# Patient Record
Sex: Female | Born: 1955 | Race: White | Hispanic: No | Marital: Married | Smoking: Former smoker
Health system: Southern US, Community
[De-identification: ages and names within clinical notes are randomized; demographics above are authoritative.]

## PROBLEM LIST (undated history)

## (undated) DIAGNOSIS — E039 Hypothyroidism, unspecified: Secondary | ICD-10-CM

## (undated) DIAGNOSIS — Z8719 Personal history of other diseases of the digestive system: Secondary | ICD-10-CM

## (undated) DIAGNOSIS — T8859XA Other complications of anesthesia, initial encounter: Secondary | ICD-10-CM

## (undated) DIAGNOSIS — K921 Melena: Secondary | ICD-10-CM

## (undated) DIAGNOSIS — F411 Generalized anxiety disorder: Secondary | ICD-10-CM

## (undated) DIAGNOSIS — Z9689 Presence of other specified functional implants: Secondary | ICD-10-CM

## (undated) DIAGNOSIS — K219 Gastro-esophageal reflux disease without esophagitis: Secondary | ICD-10-CM

## (undated) DIAGNOSIS — Z87891 Personal history of nicotine dependence: Secondary | ICD-10-CM

## (undated) DIAGNOSIS — R569 Unspecified convulsions: Secondary | ICD-10-CM

## (undated) DIAGNOSIS — I10 Essential (primary) hypertension: Secondary | ICD-10-CM

## (undated) DIAGNOSIS — F32A Depression, unspecified: Secondary | ICD-10-CM

## (undated) DIAGNOSIS — Z9889 Other specified postprocedural states: Secondary | ICD-10-CM

## (undated) DIAGNOSIS — R112 Nausea with vomiting, unspecified: Secondary | ICD-10-CM

## (undated) DIAGNOSIS — I071 Rheumatic tricuspid insufficiency: Secondary | ICD-10-CM

## (undated) DIAGNOSIS — T4145XA Adverse effect of unspecified anesthetic, initial encounter: Secondary | ICD-10-CM

## (undated) DIAGNOSIS — I83819 Varicose veins of unspecified lower extremities with pain: Secondary | ICD-10-CM

## (undated) DIAGNOSIS — K529 Noninfective gastroenteritis and colitis, unspecified: Secondary | ICD-10-CM

## (undated) DIAGNOSIS — K589 Irritable bowel syndrome without diarrhea: Secondary | ICD-10-CM

## (undated) DIAGNOSIS — R002 Palpitations: Secondary | ICD-10-CM

## (undated) DIAGNOSIS — M1990 Unspecified osteoarthritis, unspecified site: Secondary | ICD-10-CM

## (undated) DIAGNOSIS — E041 Nontoxic single thyroid nodule: Secondary | ICD-10-CM

## (undated) DIAGNOSIS — M8580 Other specified disorders of bone density and structure, unspecified site: Secondary | ICD-10-CM

## (undated) DIAGNOSIS — R079 Chest pain, unspecified: Secondary | ICD-10-CM

## (undated) DIAGNOSIS — N390 Urinary tract infection, site not specified: Secondary | ICD-10-CM

## (undated) DIAGNOSIS — E042 Nontoxic multinodular goiter: Secondary | ICD-10-CM

## (undated) HISTORY — PX: OOPHORECTOMY: SHX86

## (undated) HISTORY — PX: APPENDECTOMY: SHX54

## (undated) HISTORY — PX: COLON SURGERY: SHX602

## (undated) HISTORY — PX: KNEE ARTHROSCOPY: SUR90

## (undated) HISTORY — PX: IMPLANTATION VAGAL NERVE STIMULATOR: SUR692

## (undated) HISTORY — PX: CHOLECYSTECTOMY: SHX55

## (undated) HISTORY — PX: SHOULDER ARTHROSCOPY: SHX128

## (undated) HISTORY — PX: WRIST SURGERY: SHX841

## (undated) HISTORY — PX: SALIVARY GLAND SURGERY: SHX768

---

## 1994-06-16 HISTORY — PX: AUGMENTATION MAMMAPLASTY: SUR837

## 2009-08-28 LAB — URINALYSIS W/O MICRO
Bilirubin UA, confirm: NEGATIVE
Glucose: 100 MG/DL
Ketone: >=80 MG/DL — AB
Leukocyte Esterase: NEGATIVE
Nitrites: NEGATIVE
Protein: 30 MG/DL — AB
Specific gravity: >=1.03 (ref 1.005–1.030)
Urobilinogen: 0.2 EU/DL (ref 0.2–1.0)
pH (UA): 6 (ref 4.6–8.0)

## 2009-08-28 LAB — METABOLIC PANEL, COMPREHENSIVE
A-G Ratio: 0.9 (ref 0.7–2.8)
ALT (SGPT): 29 U/L — ABNORMAL LOW (ref 30–65)
AST (SGOT): 21 U/L (ref 15–37)
Albumin: 4.3 g/dL (ref 3.5–4.7)
Alk. phosphatase: 113 U/L (ref 38–126)
Anion gap: 15 mmol/L (ref 10–20)
BUN: 18 mg/dL (ref 7–18)
Bilirubin, total: 0.5 mg/dL (ref 0.0–1.0)
CO2: 22 mmol/L (ref 21–32)
Calcium: 9.7 mg/dL (ref 8.5–10.1)
Chloride: 104 mmol/L (ref 98–107)
Creatinine: 1.1 mg/dL (ref 0.6–1.3)
Globulin: 4.6 g/dL (ref 1.7–4.7)
Glucose: 127 mg/dL — ABNORMAL HIGH (ref 74–106)
Potassium: 3.4 mmol/L — ABNORMAL LOW (ref 3.5–5.1)
Protein, total: 8.9 g/dL — ABNORMAL HIGH (ref 6.4–8.2)
Sodium: 138 mmol/L (ref 136–145)

## 2009-08-28 LAB — CBC WITH AUTOMATED DIFF
ATYPICAL LYMPHS: 2 % — ABNORMAL HIGH
BASOPHILS: 1 % (ref 0–3)
HCT: 49 % — ABNORMAL HIGH (ref 36.0–47.0)
HGB: 17.4 g/dL — ABNORMAL HIGH (ref 12.0–16.0)
LYMPHOCYTES: 4 % — ABNORMAL LOW (ref 18–40)
MCH: 31.6 PG (ref 27.0–35.0)
MCHC: 35.5 g/dL (ref 30.7–37.3)
MCV: 88.9 FL (ref 81.0–94.0)
MONOCYTES: 6 % (ref 2–12)
MPV: 10.4 FL (ref 9.2–11.8)
NEUTROPHILS: 87 % — ABNORMAL HIGH (ref 48–72)
PLATELET ESTIMATE: ADEQUATE
PLATELET: 312 10*3/uL (ref 130–400)
RBC: 5.51 M/uL — ABNORMAL HIGH (ref 4.20–5.40)
RDW: 13.2 % (ref 11.5–14.0)
WBC: 24 10*3/uL — ABNORMAL HIGH (ref 4.8–10.6)

## 2009-08-28 LAB — URINE MICROSCOPIC: Crystals, urine: NONE SEEN /LPF

## 2009-08-28 LAB — LIPASE: Lipase: 119 U/L (ref 65–230)

## 2009-08-28 MED ORDER — ONDANSETRON HCL 4 MG TAB
4 mg | ORAL_TABLET | Freq: Three times a day (TID) | ORAL | Status: DC | PRN
Start: 2009-08-28 — End: 2012-09-02

## 2009-08-28 MED ORDER — SODIUM CHLORIDE 0.9 % IV
INTRAVENOUS | Status: DC
Start: 2009-08-28 — End: 2009-08-28

## 2009-08-28 MED ORDER — ONDANSETRON HCL 2 MG/ML IV
2 mg/mL | INTRAVENOUS | Status: DC | PRN
Start: 2009-08-28 — End: 2009-08-28

## 2009-08-28 MED ORDER — KETOROLAC TROMETHAMINE 30 MG/ML INJECTION
30 mg/mL (1 mL) | INTRAMUSCULAR | Status: DC
Start: 2009-08-28 — End: 2009-08-28

## 2009-08-28 MED FILL — ONDANSETRON HCL 2 MG/ML IV: 2 mg/mL | INTRAVENOUS | Qty: 2

## 2009-08-28 MED FILL — KETOROLAC TROMETHAMINE 30 MG/ML INJECTION: 30 mg/mL (1 mL) | INTRAMUSCULAR | Qty: 1

## 2009-08-28 NOTE — ED Notes (Signed)
Pt pain tolerable. Pt feeling better after zofran. Pt does not want pain medication.

## 2009-08-28 NOTE — ED Notes (Signed)
IV out. Tip intact.

## 2009-08-28 NOTE — ED Notes (Signed)
Pt being discharged to home. Pt verbalized an understanding of all discharge instructions. Pt left in no acute distress. Prescription given.

## 2009-08-28 NOTE — ED Notes (Signed)
Pt in no distress . VSS.

## 2009-08-28 NOTE — ED Notes (Signed)
Pt has had Abd Pain, Vomiting, Nausea, and diarrhea since yesterday afternoon.

## 2009-08-28 NOTE — ED Provider Notes (Signed)
Patient is a 54 y.o. female presenting with abdominal pain. The history is provided by the patient.   Abdominal Pain   This is a new problem. The current episode started yesterday. The problem occurs constantly. The problem has not changed since onset. The pain is associated with vomiting. The pain is located in the generalized abdominal region. The quality of the pain is cramping and sharp. Associated symptoms include diarrhea, nausea and vomiting. The pain is worsened by eating. The pain is relieved by nothing.   pt c/o gen crampy abd pain in association with multiple episodes of vomiting and diarea since yesterday.  No blood in stool    Past Medical History   Diagnosis Date   ??? Diverticulitis 12/10   ??? Epilepsy           Past Surgical History   Procedure Date   ??? Hc nerve location stimulator 03/2001     vagus nerve stimulator, per patient   ??? Excision of salivary gland    ??? Removal gallbladder    ??? Hx appendectomy    ??? Pr removal of ovary(s)    ??? Hx gyn            No family history on file.     History   Social History   ??? Marital Status: Married     Spouse Name: N/A     Number of Children: N/A   ??? Years of Education: N/A   Occupational History   ??? Not on file.   Social History Main Topics   ??? Smoking status: Former Smoker   ??? Smokeless tobacco: Never Used   ??? Alcohol Use: Yes      once a year   ??? Drug Use: No   ??? Sexually Active: Yes -- Female partner(s)   Other Topics Concern   ??? Not on file   Social History Narrative   ??? No narrative on file       Prior to Admission medications    Medication Sig Start Date End Date Taking? Authorizing Provider   levothyroxine (LEVOXYL) 75 mcg tablet Take 75 mcg by mouth daily (before breakfast).   Yes Phys Other, MD   amitriptyline (ELAVIL) 150 mg tablet Take 20 mg by mouth nightly.   Yes Phys Other, MD           ALLERGIES: Dilantin and Depakote      Review of Systems   Constitutional: Negative.    HENT: Negative.    Eyes: Negative.    Respiratory: Negative.     Cardiovascular: Negative.    Gastrointestinal: Positive for nausea, vomiting, abdominal pain and diarrhea.   Genitourinary: Negative.    Musculoskeletal: Negative.    Skin: Negative.    Neurological: Negative.        Filed Vitals:    08/28/09 0619 08/28/09 0836   BP: 137/93 123/76   Pulse: 115 91   Temp:  97.3 ??F (36.3 ??C)   Resp: 18    Height: 5\' 6"     Weight: 160 lb (72.576 kg)    SpO2: 100% 100%              Physical Exam   Nursing note and vitals reviewed.  Constitutional: She is oriented to person, place, and time. No distress.   HENT:   Head: Normocephalic and atraumatic.   Eyes: Extraocular motions are normal. Pupils are equal, round, and reactive to light.   Neck: Neck supple.   Cardiovascular: Normal rate, regular rhythm  and normal heart sounds.  Exam reveals no gallop and no friction rub.    No murmur heard.  Pulmonary/Chest: Effort normal. No respiratory distress. She has no wheezes. She has no rales.   Abdominal: Soft. She exhibits no distension. Tenderness (diffuse) is present. She has no rebound and no guarding.   Musculoskeletal: Normal range of motion. She exhibits no edema.   Neurological: She is alert and oriented to person, place, and time. No cranial nerve deficit.   Skin: No rash noted.        MDM Coding   Reviewed: vitals and nursing note        Procedures    Impression- ddx includes gastroenteritis, colitis, appendicitis, diverticulitis, pyelo  Plan- cbc, lytes, ua, ivf, pain meds, ct abd/pelvis, observe, reases    Progress note: labs noted.  CT- no acute pathology as per radiology reading.  Pt feels better, abd pain is crampy,. abd soft non-tender to distraction.   On and off.  Pt tolerated po intake w/o difficulty in ed.  Wbc noted.  Given clinical appearance and ct findings- likely gastroenteritis.  Disc with pt therapeutic options.  Pt prefers to go home, po hydration.  Agrees to return to ed immediately for worsenig pain, recurence of vomitnig or fever.  12:37 PM

## 2009-08-28 NOTE — ED Notes (Signed)
Pt finished drinking po contrast. CT tech aware.

## 2009-08-28 NOTE — ED Notes (Signed)
Pt nausea relieved from zofran. Pt started drinking po contrast.

## 2009-08-28 NOTE — ED Notes (Signed)
Pt to CT. Pt in no distress.

## 2012-04-01 NOTE — Patient Instructions (Signed)
Preventative Measures:   Safety- Safety reviewed; no drunk driving, no texting/phone holding while driving, always use seatbelts and safety equipment  Sunscreen- Reviewed sunscreen use, at least SPF 15 any time of year, if outside more than 5 minutes  Detectors: Patient has smoke detector and carbon monoxide detector  Diet and Exercise: Reviewed a healthy diet and recommended regular exercise 3+ times per week, 30+ minutes per episode  Self exams: Reviewed self breast exam monthly  MMG done 4/13, due 4/14  Gynecology/PAP done years ago, due now  Screening colonoscopy due 2016  DEXA done more than 2 years ago, due now  Vaccinations- tetanus due 2017, shingles vaccine at 60, Pneumovax/Prevnar at 65, flu shot today    Follow-up as needed or in one year for exam

## 2012-04-01 NOTE — Progress Notes (Signed)
[facility] Annual Wellness Visit    Date: 04/01/2012    Name: Vanessa Wilson       DOB: 1956/02/08     History of Present Illness: Vanessa Wilson is a 56 y.o. year old female who is here for annual exam. She states that she went to see a bereavement counselor yesterday for the first time.  Her 5 year-old son died of a drug overdose 2 years ago and she is still extremely depressed about it.  She feels better that she went.  The counselor gave her some hope that she might get better.  She feels pains all over but she is not sure if the pain is real or psychological.  She states that her joints are stiff and achy.  She still has right knee pain after surgery and joint injections, but her insurance changed so she cannot see Dr. Darliss Cheney anymore because they do not take that insurance.  She is not doing much exercise as a result.  She is also eating more and gaining weight.  This makes her more depressed.     Exercise: not much     Diet: too much quantity and not the right foods    Living will: yes    Past Medical History/Surgical History:  Past Medical History   Diagnosis Date   ??? Diverticulitis 12/10   ??? Epilepsy    ??? IBS (irritable bowel syndrome)    ??? Fibromyalgia    ??? Chronic gastritis    ??? Thyromegaly      Past Surgical History   Procedure Laterality Date   ??? Hc nerve location stimulator  03/2001     vagus nerve stimulator, per patient   ??? Excision of salivary gland     ??? Hx cholecystectomy     ??? Hx salpingo-oophorectomy       left   ??? Hx colectomy       low anterior resection   ??? Hx tubal ligation     ??? Hx cyst removal     ??? Hx hemorrhoidectomy     ??? Hx shoulder arthroscopy       right rotator cuff repair       Family History:  family history is not on file.    Medications:  Current Outpatient Prescriptions   Medication Sig Dispense Refill   ??? amitriptyline (ELAVIL) 25 mg tablet Take  by mouth nightly.       ??? levothyroxine (SYNTHROID) 50 mcg tablet Take  by mouth Daily (before breakfast).       ??? multivitamin (ONE A DAY)  tablet Take 1 Tab by mouth daily.       ??? zolpidem (AMBIEN) 10 mg tablet Take  by mouth nightly as needed for Sleep.       ??? omeprazole (PRILOSEC) 20 mg capsule Take 20 mg by mouth daily as needed. Indications: GASTROESOPHAGEAL REFLUX       ??? LORazepam (ATIVAN) 1 mg tablet Take  by mouth every four (4) hours as needed for Anxiety.       ??? omega-3 fatty acids-vitamin e (FISH OIL) 1,000 mg cap Take 1 Cap by mouth.       ??? ondansetron hcl (ZOFRAN) 4 mg tablet Take 1 Tab by mouth every eight (8) hours as needed for Nausea.  10 Tab  0     No current facility-administered medications for this visit.        Allergies:  Allergies   Allergen Reactions   ??? Dilantin (Phenytoin  Sodium Extended) Itching   ??? Depakote (Divalproex) Hives       Social History:   History   Smoking status   ??? Former Smoker   Smokeless tobacco   ??? Never Used     History   Alcohol Use   ??? Yes     Comment: once a year     History   Drug Use No       Review of Systems:  General:  No fevers, chills, night sweats, lymph node enlargement, or unintentional weight loss; there is a fullness in her left neck that she thinks might be scar tissue or maybe her thyroid  HENT:  No ear pain, ear discharge, hearing loss, sinus problems, nose bleeds, allergies, sore throats, gum bleeding, face pain.  Eyes:  No pain, blurred vision, double vision, wears glasses for reading  Pulmonary: no wheeze, cough, shortness of breath, no pain  Heart: + rest chest tightness without associated symptoms of palpitations and dyspne; no, hypertension, or high cholesterol  GI:  No abdominal pain, nausea, or vomiting,+  heartburn, no dysphagia, change in bowel habits, hematochezia or black stools  GU:  No urgency, frequency, dysuria, incontinence; has once per night nocturia  Neurology:  No dizziness, numbness/tingling, weakness, seizures  Skin:  No rashes, abnormal moles or hives  Musculoskeletal:  + back pain, neck pain, and joint pains (diffuse and also specific right knee pain)  Psych:  + 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 Cervical Cancer Scrn Pap Smear overdue due   ??? Breast Cancer Scrn Mammogram done 09/23/11 4/14   ??? Colon Cancer Scrn Colonoscopy done 07/2009 2/16   ??? DEXA not in a few years due       Vaccinations:    Immunization History   Administered Date(s) Administered   ??? Influenza, Seasonal, Injectable 03/31/2012   ??? Td (Adult) 10/14/2005        Physical Exam:  Filed Vitals:    03/31/12 1049 03/31/12 1058   BP: 124/80 120/78   Pulse:  92   Height: 5\' 6"  (1.676 m)    Weight: 178 lb (80.74 kg)      Body mass index is 28.74 kg/(m^2).    HEAD: WNL, NCAT, PERRL, EOMI, no conj erythema, no jaundice, FUNDI-normal  EARS: WNL, no cerumen, no otitis media, no otitis external  NOSE: WNL, no polyps, no bleeding, no erythema  MOUTH: WNL, no erythema, no exudates, no enlarged tonsils  NECK: WNL, no bruit, no masses, possible thyromegaly vs left neck fullness from scar tissue, no nodes, no JVD  HEART: WNL, RRR: normal S1, S2: no S3 or S4; no rubs or murmurs  LUNGS: WNL, resonant, clear, no wheezing, no ronchi, no rales  ABD: WNL, no hepatomegaly, splenomegaly, guarding, rebound, or tenderness  BREAST: no fibrocystic changes; no nodes, discharge, or masse; no LAD  BACK: WNL, no CVA-T, spinous tendernous, or spasm  EXT: no clubbing, cyanosis, or edema; 2+ radial pulses, 2+ dorsalis pedis pulses, 2+ posterior tibialis pulses  SKIN: no rash, nodules, or cysts; mulitple non-suspicious moles  NEURO: WNL, CN III-XII normal, sensory intact to light touch, motor 5/5 bilaterally, reflexes 2+/2+ except Achilles -/-  PSYCH: AFFECT-sad to normal, ORIENTATION- normal to person, place, and time    LABS: No results found for this or any previous visit (from the past 24 hour(s)).  10/31/11 CBC and CMP were normal  ECG: from 10/31/11 NSR rate 70, normal axis and intervals, Rsr' in V1 and V2, T wave inversion in III    Impression/Plan:   1. Annual physical exam  : see below    2. Need for prophylactic vaccination and inoculation against influenza  INFLUENZA VIRUS VACCINE, SPLIT, IN INDIVIDS. >=3 YRS OF AGE, IM, PR IMMUNIZ ADMIN,1 SINGLE/COMB VAC/TOXOID   3. Fibromyalgia : possible that this is why the patient is complaining of aches.  I believe that she will benefit greatly from therapy for her depression and that improvement in the depression will help alleviate her pain.    4. Epilepsy : controlled, no seizure activity in years.    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  Patient may return as needed.   She is low dose amitriptyline for her fibromyalgia but if she does not improve she may benefit from additional antidepressant.    6. IBS (irritable bowel syndrome) :  Patient with diarrhea and constipation on and off.  Not interfering with lifestyle.  Attempt diet control.  Probiotic may be helpful    7. Knee pain, right :  To follow-up with orthopedics as this will help the patient get back to exercise.    8. Insomnia:  Continue Ambien as needed.    9. Thyromegaly/left neck fullness:  Will check thyroid and left neck ultrasound.  Will also check TSH.          Preventative Measures:   Safety- Safety reviewed; no drunk driving, no texting/phone holding while driving, always use seatbelts and safety equipment  Sunscreen- Reviewed sunscreen use, at least SPF 15 any time of year, if outside more than 5 minutes  Detectors: Patient has smoke detector and carbon monoxide detector  Diet and Exercise: Reviewed a healthy diet and recommended regular exercise 3+ times per week, 30+ minutes per episode  Self exams: Reviewed self breast exam monthly  MMG done 4/13, due 4/14  Gynecology/PAP done years ago, due now  Screening colonoscopy due 2016  DEXA done more than 2 years ago, due now  Vaccinations- tetanus due 2017, shingles vaccine at 60, Pneumovax/Prevnar at 65, flu shot today    Follow-up as needed or in one year for exam      Carolin Sicks, MD    04/01/2012

## 2012-04-14 LAB — ANA BY MULTIPLEX FLOW IA, QL
ANA, DIRECT: NEGATIVE
Antinuclear Antibodies Direct: NEGATIVE

## 2012-04-14 LAB — RHEUMATOID FACTOR
Rheumatoid Factor: 9.5 IU/mL (ref 0.0–13.9)
Rheumatoid factor: 9.5 IU/mL (ref 0.0–13.9)

## 2012-04-14 LAB — TSH 3RD GENERATION: TSH: 3.27 u[IU]/mL (ref 0.450–4.500)

## 2012-04-14 LAB — LIPID PANEL WITH LDL/HDL RATIO
Cholesterol, total: 238 mg/dL — ABNORMAL HIGH (ref 100–199)
HDL Cholesterol: 58 mg/dL (ref >39)
LDL, calculated: 149 mg/dL — ABNORMAL HIGH (ref 0–99)
LDL/HDL Ratio: 2.6 ratio units (ref 0.0–3.2)
Triglyceride: 153 mg/dL — ABNORMAL HIGH (ref 0–149)
VLDL, calculated: 31 mg/dL (ref 5–40)

## 2012-04-14 LAB — AMBIG ABBREV LP DEFAULT

## 2012-04-14 LAB — LYME IGG,IGM W RFLX CONFIRM: Lyme Ab, IgG/IgM: <0.91 index (ref 0.00–0.90)

## 2012-04-14 LAB — VITAMIN B12 & FOLATE
Folate: 15.8 ng/mL (ref >3.0)
Vitamin B12: 706 pg/mL (ref 211–946)

## 2012-04-14 LAB — SED RATE (ESR): Sed rate (ESR): 6 mm/hr (ref 0–40)

## 2012-04-14 NOTE — Progress Notes (Signed)
The patient's UA from 10/16 was sent out and a UCx was done. Her UA was still abnormal but it was not a good sample as it had many epithelial cells.  The patient should return and repeat the UA.  She needs a mid-stream specimen.

## 2012-04-15 ENCOUNTER — Telehealth

## 2012-04-15 LAB — LYME AB, IGG & IGM BY WB
IgG P18 Ab: ABSENT
IgG P23 Ab: ABSENT
IgG P28 Ab: ABSENT
IgG P30 Ab: ABSENT
IgG P39 Ab: ABSENT
IgG P41 Ab: ABSENT
IgG P45 Ab: ABSENT
IgG P58 Ab: ABSENT
IgG P93 Ab: ABSENT
IgM P23 Ab: ABSENT
IgM P39 Ab: ABSENT
IgM P41 Ab: ABSENT
Lyme Ab, IgG WB Interp.: NEGATIVE
Lyme Ab, IgM WB Interp.: NEGATIVE

## 2012-04-15 NOTE — Telephone Encounter (Signed)
I called and left a message for patient to call me back.  Her DEXA showed osteopenia (some bone density loss) and her labs showed a high cholesterol, normal TSH, B12, folate, ESR, RF.  Patient really needs to exercise and eat well, but may need medication of she cannot control the cholesterol with diet and exercise alone.  She may be given the results if she calls back.

## 2012-04-15 NOTE — Progress Notes (Signed)
Quick Note:    The cholesterol is high (TC 238, LDL 149, TG 238, HDL 58), Z61, folate, TSH, ESR, RF are normal or negative.  ______

## 2012-04-19 NOTE — Telephone Encounter (Signed)
Left pt a message to call back for results/ as per Dr. Cherlynn June she needs to repeat her UA, she should urinate into the toilet 1st(a little) then into the specimen cup to get a clean catch

## 2012-04-21 NOTE — Progress Notes (Signed)
Quick Note:    ANA and lyme titers are negative.  ______

## 2012-04-21 NOTE — Telephone Encounter (Signed)
I called the patient to discuss her lab test results.  In addition, her ANA and lyme titers are negative.  I left her a message to call me back.

## 2012-04-21 NOTE — Telephone Encounter (Signed)
I called and spoke to the patient.  I gave her all the results of her labs, DEXA, and neck and thyroid ultrasounds.  She will go for an U/S-guided right thyroid nodule biopsy (request will be picked up).  She would like a prescription for the omeprazole, levothyroxine, and amitriptyline sent to [facility] in [place] (which was just done).  She will change her diet and attempt exercise (even though limited by her knee pain) in order to control the cholesterol and that should be repeated in 3 months.  She will try resistance training for her osteopenia.

## 2012-04-22 MED ORDER — LEVOTHYROXINE 50 MCG TAB
50 mcg | ORAL_TABLET | Freq: Every day | ORAL | Status: DC
Start: 2012-04-22 — End: 2013-01-28

## 2012-04-22 MED ORDER — OMEPRAZOLE 20 MG CAP, DELAYED RELEASE
20 mg | ORAL_CAPSULE | Freq: Every day | ORAL | Status: DC | PRN
Start: 2012-04-22 — End: 2013-03-07

## 2012-04-22 MED ORDER — AMITRIPTYLINE 25 MG TAB
25 mg | ORAL_TABLET | Freq: Every evening | ORAL | Status: DC
Start: 2012-04-22 — End: 2013-05-09

## 2012-07-13 LAB — HVMA POC URINALYSIS DIP STICK AUTO W/O MICRO
Bilirubin (POC): NEGATIVE
Glucose, urine (POC): NEGATIVE
Ketones (POC): NEGATIVE
Nitrites (UA POC): NEGATIVE
Specific gravity (UA POC): 1.025 (ref 1.001–1.035)
Urobilinogen (UA POC): 0.2 (ref 0.2–1)
pH (UA POC): 6.5 (ref 4.6–8.0)

## 2012-07-13 MED ORDER — LORAZEPAM 1 MG TAB
1 mg | ORAL_TABLET | Freq: Three times a day (TID) | ORAL | Status: DC | PRN
Start: 2012-07-13 — End: 2012-12-08

## 2012-07-13 NOTE — Patient Instructions (Addendum)
Rest.    Make sure that you drink water to quench your thirst.    Humidifier and hot steamy showers can help with the congestion.    Nasal saline can help with the dryness in the nose.    If you are not better or if you get worse then give me a call.    Take your Prilosec daily and modify your diet.      Gastroesophageal Reflux Disease (GERD): After Your Visit  Your Care Instructions  Gastroesophageal reflux disease (GERD) is the backward flow of stomach acid into the esophagus. The esophagus is the tube that leads from your throat to your stomach. A one-way valve prevents the stomach acid from moving up into this tube. When you have GERD, this valve does not close tightly enough.  If you have mild GERD symptoms including heartburn, you may be able to control the problem with antacids or over-the-counter medicine. Changing your diet, losing weight, and making other lifestyle changes can also help reduce symptoms.  Follow-up care is a key part of your treatment and safety. Be sure to make and go to all appointments, and call your doctor if you are having problems. It???s also a good idea to know your test results and keep a list of the medicines you take.  How can you care for yourself at home?  ?? Take your medicines exactly as prescribed. Call your doctor if you think you are having a problem with your medicine.  ?? Your doctor may recommend over-the-counter medicine. For mild or occasional indigestion, antacids, such as Tums, Gaviscon, Mylanta, or Maalox, may help. Your doctor also may recommend over-the-counter acid reducers, such as Pepcid AC, Tagamet HB, Zantac 75, or Prilosec. Read and follow all instructions on the label. If you use these medicines often, talk with your doctor.  ?? Change your eating habits.  ?? It???s best to eat several small meals instead of two or three large meals.  ?? After you eat, wait 2 to 3 hours before you lie down.  ?? Chocolate, mint, and alcohol can make GERD worse.  ?? Spicy foods, foods  that have a lot of acid (like tomatoes and oranges), and coffee can make GERD symptoms worse in some people. If your symptoms are worse after you eat a certain food, you may want to stop eating that food to see if your symptoms get better.  ?? Do not smoke or chew tobacco. Smoking can make GERD worse. If you need help quitting, talk to your doctor about stop-smoking programs and medicines. These can increase your chances of quitting for good.  ?? If you have GERD symptoms at night, raise the head of your bed 6 to 8 inches by putting the frame on blocks or placing a foam wedge under the head of your mattress. (Adding extra pillows does not work.)  ?? Do not wear tight clothing around your middle.  ?? Lose weight if you need to. Losing just 5 to 10 pounds can help.  When should you call for help?  Call your doctor now or seek immediate medical care if:  ?? You have new or different belly pain.  ?? Your stools are black and tarlike or have streaks of blood.  Watch closely for changes in your health, and be sure to contact your doctor if:  ?? Your symptoms have not improved after 2 days.  ?? Food seems to catch in your throat or chest.   Where can you learn more?  Go to [email or web address]  Enter [phone] in the search box to learn more about "Gastroesophageal Reflux Disease (GERD): After Your Visit."   ?? 2006-2013 [employer]. Care instructions adapted under license by [employer] (which disclaims liability or warranty for this information). This care instruction is for use with your licensed healthcare professional. If you have questions about a medical condition or this instruction, always ask your healthcare professional. [employer] disclaims any warranty or liability for your use of this information.  Content Version: 9.9.209917; Last Revised: Oct 31, 2011

## 2012-07-13 NOTE — Progress Notes (Signed)
CC: throat pain and sinus congestion since Friday or Saturday    HPI:  Vanessa Wilson is a 57 y.o. year old female who is here for complaints of bad left sided-throat pain that has gone up into her sinuses.  She feels like her sinuses are congested, mild headache, slight cough, sore throat has resolved.  Has clear mucus when blows her nose.  No fever but a little chilled.  Appetite is good.  Symptoms started 4 days ago.  Have gotten no worse, they are about the same.  Main complaint now is sinus congestion and pressure with slight cough.  There is no shortness of breath.  She has taken nothing for the symptoms because when she takes the cold medications she ends up retaining water.    Symptoms:  no fevers, slight chills, no night sweats, + nasal congestion, + facial pain, no ear pain, no sore throat, + left  neck pain, slight cough, chronic joint pains, no muscle aches, no nausea, no vomiting, no diarrhea, epigastric abdominal pain, no dysuria, no urinary frequency, no chest pain,no  shortness of breath, no palpitations, no leg edema, no lightheadedness, no syncope, no localized numbness/weakness, and no headaches.    Other complaints include: Epigastric pain that has been occuring for some time.  It hurts her when she pushes on there stomach.  She is on a PPI but not constantly.  She does feel like she still has heartburn.  No weight loss and appetite is good.  No bloody or black stools.  She had an abnormal UA at her last visit.    Past History:   Past Medical History   Diagnosis Date   ??? Diverticulitis 12/10   ??? Epilepsy    ??? IBS (irritable bowel syndrome)    ??? Fibromyalgia    ??? Chronic gastritis    ??? Thyromegaly    ??? Hyperlipidemia 04/14/12   ??? Osteopenia 04/14/12     hip and spine     Allergies:   Allergies   Allergen Reactions   ??? Dilantin (Phenytoin Sodium Extended) Itching   ??? Depakote (Divalproex) Hives     Social History:   History   Smoking status   ??? Former Smoker   Smokeless tobacco   ??? Never Used      History   Drug Use No     History   Alcohol Use   ??? Yes     Comment: once a year       Medications:   Current Outpatient Prescriptions   Medication Sig Dispense Refill   ??? LORazepam (ATIVAN) 1 mg tablet Take 1 Tab by mouth every eight (8) hours as needed for Anxiety.  60 Tab  0   ??? amitriptyline (ELAVIL) 25 mg tablet Take 1 Tab by mouth nightly.  30 Tab  5   ??? omeprazole (PRILOSEC) 20 mg capsule Take 1 Cap by mouth daily as needed. Indications: GASTROESOPHAGEAL REFLUX  30 Cap  5   ??? levothyroxine (SYNTHROID) 50 mcg tablet Take 1 Tab by mouth Daily (before breakfast).  30 Tab  5   ??? multivitamin (ONE A DAY) tablet Take 1 Tab by mouth daily.       ??? zolpidem (AMBIEN) 10 mg tablet Take  by mouth nightly as needed for Sleep.       ??? omega-3 fatty acids-vitamin e (FISH OIL) 1,000 mg cap Take 1 Cap by mouth.       ??? ondansetron hcl (ZOFRAN) 4 mg tablet Take 1  Tab by mouth every eight (8) hours as needed for Nausea.  10 Tab  0       Review of Systems:  See HPI    Physical Exam  Vitals:  BP 134/80   Pulse 92   Temp(Src) 97.7 ??F (36.5 ??C)   Ht 5\' 6"  (1.676 m)   Wt 182 lb (82.555 kg)   BMI 29.39 kg/m2  Body mass index is 29.39 kg/(m^2).  Filed Vitals:    07/13/12 1222 07/13/12 1300   BP: 140/82 134/80   Pulse: 92    Temp: 97.7 ??F (36.5 ??C)    Height: 5\' 6"  (1.676 m)    Weight: 182 lb (82.555 kg)        HEAD: WNL, NCAT, no conj erythema, no jaundice; no facial tenderness to palpation  EARS: WNL, no cerumen, no otitis media, no otitis external  NOSE: WNL, no polyps, no bleeding, no erythema  MOUTH: mild erythema, no exudates, no enlarged tonsils  NECK: WNL, no bruit, no masses, no thyromegaly, no nodes, no JVD  HEART: WNL, RRR: normal S1, S2; no rubs or murmurs  LUNGS: WNL, resonant, clear, no wheezing, no ronchi, no rales  ABD: WNL, no hepatomegaly, splenomegaly; + epigastric tenderness to palpation without guarding, rebound; no masses  BACK: WNL, no CVA-T, Spinous tendernous, or spasm  EXT: no clubbing, cyanosis, or edema;  2+ radial pulses, 2+ dorsalis pedis pulses, 2+ posterior tibialis pulses    LABS:  Recent Results (from the past 12 hour(s))   [facility] POC URINALYSIS DIP STICK AUTO W/O MICRO    Collection Time     07/13/12  2:13 PM       Result Value Range    Glucose, urine (POC) Negative  Negative    Bilirubin (POC) Negative      Ketones (POC) Negative  Negative    Specific gravity (UA POC) 1.025  1.001 - 1.035    Blood (POC) Small      pH (UA POC) 6.5  4.6 - 8.0    Protein (POC) Trace  Negative    Urobilinogen (UA POC) 0.2 mg/dL  0.2 - 1    Nitrites (UA POC) Negative  Negative    Leukocyte esterase (POC) Moderate      Color (UA POC) Yellow      Clarity (UA POC) Clear     Small blood with moderate leukocyte esterase      Orders:   Orders Placed This Encounter   ??? POC URINALYSIS DIP STICK AUTO W/O MICRO   ??? LORazepam (ATIVAN) 1 mg tablet     Sig: Take 1 Tab by mouth every eight (8) hours as needed for Anxiety.     Dispense:  60 Tab     Refill:  0       Assessment/Plan:   1. URI (upper respiratory infection) :  Conservative therapy.  If not improved then give me a call.   2. Hypothyroid : continue with your thyroid medication.   3. Gastritis : Rake the Prilosec 20 mg daily every day and avoid acidic foods and alcohol.  Let me now if you are not better.   4. Microscopic hematuria :  UA today was again abnormal.  Witll check UA and Urine Culture.  If negative urine culture will need further evaluation.     Follow-up as needed    Carolin Sicks, MD  07/13/2012

## 2012-07-13 NOTE — Progress Notes (Signed)
Quick Note:    Small blood and moderate leukocyte esterase. Please send for UA and Urine Culture.  ______

## 2012-07-16 ENCOUNTER — Telehealth

## 2012-07-16 NOTE — Telephone Encounter (Signed)
Patient is aware of her UA (positive for WBC, bacteria, and blood) and UCx (Strep group B >100,000 CFU) results.  She has burning when she urinates.  I will treat her for the Strep group B infection with Amoxicillin 875 mg BID for 7 days.  She will return and leave a UA and UC in 3 weeks if the symptoms persist.

## 2012-07-17 MED ORDER — AMOXICILLIN 875 MG TAB
875 mg | ORAL_TABLET | Freq: Two times a day (BID) | ORAL | Status: AC
Start: 2012-07-17 — End: 2012-07-23

## 2012-09-02 NOTE — Progress Notes (Signed)
Forms completed and sent to [facility].

## 2012-09-02 NOTE — Patient Instructions (Signed)
MyChart Activation    Thank you for requesting access to MyChart. Please follow the instructions below to securely access and download your online medical record. MyChart allows you to send messages to your doctor, view your test results, renew your prescriptions, schedule appointments, and more.    How Do I Sign Up?    1. In your internet browser, go to [email or web address].  2. Click on the First Time User? Click Here link in the Sign In box. You will see the New Member Sign Up page.  3. Enter your MyChart Access Code exactly as it appears below. You will not need to use this code after you???ve completed the sign-up process. If you do not sign up before the expiration date, you must request a new code.    MyChart Access Code: [email or web address]  Expires: 12/01/2012 11:23 AM (This is the date your MyChart access code will expire)    4. Enter the last four digits of your Social Security Number (xxxx) and Date of Birth (mm/dd/yyyy) as indicated and click Submit. You will be taken to the next sign-up page.  5. Create a MyChart ID. This will be your MyChart login ID and cannot be changed, so think of one that is secure and easy to remember.  6. Create a MyChart password. You can change your password at any time.  7. Enter your Password Reset Question and Answer. This can be used at a later time if you forget your password.   8. Enter your e-mail address. You will receive e-mail notification when new information is available in MyChart.  9. Click Sign Up. You can now view and download portions of your medical record.  10. Click the Download Summary menu link to download a portable copy of your medical information.    Additional Information    If you have questions, please visit the Frequently Asked Questions section of the MyChart website at [email or web address] Remember, MyChart is NOT to be used for urgent needs. For medical emergencies, dial 911.

## 2012-09-02 NOTE — Progress Notes (Signed)
Pt passed ishihara test.

## 2012-09-03 LAB — RUBEOLA AB, IGG: Rubeola Ab, IgG: 30.5 AU/mL — ABNORMAL HIGH (ref 0.0–24.9)

## 2012-09-03 LAB — MUMPS AB, IGG OCH ONLY-NY: Mumps Ab, IgG: POSITIVE

## 2012-09-03 LAB — RUBELLA AG, IGG OCH ONLY-NY: Rubella IgG, QL: POSITIVE

## 2012-09-04 LAB — QUANTIFERON IN TUBE REFL
QFT TB Ag minus Nil Value: 0 IU/mL
QuantiFERON Mitogen Value: >10 IU/mL
QuantiFERON Nil Value: 0.06 IU/mL
QuantiFERON TB Ag Value: 0.06 IU/mL
QuantiFERON TB Gold: NEGATIVE

## 2012-09-04 LAB — QUANTIFERON TB GOLD

## 2012-12-08 NOTE — Patient Instructions (Signed)
You can proceed to surgery.    No anti-inflammatory medications, fish oils, vitamins for 7 days before surgery.    Come back in 3-6 months for follow-up.

## 2012-12-08 NOTE — Progress Notes (Signed)
[facility] Pre-Operative Consultation    Date: 12/08/2012    Name: Vanessa Wilson       DOB: October 08, 1955     History of Present Illness: Vanessa Wilson is a 57 y.o. year old female who is here for pre-operative evaluation for replacement of her VNS.  She is having surgery on December 24, 2012.  She is having surgery at the surgery center affiliated with [facility] by Dr. Lacretia Nicks. Caryn Section.  She feels okay.  She states that she has been gluten free for 8 weeks, but does not think that she has lost weight.  She is not sure what to cut out next from her diet.  She is frustrated that her sisters and mother are all thin and that she is not.  She states that she is not eating that much and she is really trying to watch what and how much she eats.  She has not been doing any formal exercise.    Anesthesia complications: none except nausea    Functional capacity: can walk up a flight of stairs without shortness of breath    Past Medical History/Surgical History:  Past Medical History   Diagnosis Date   ??? Diverticulitis 12/10   ??? Epilepsy    ??? IBS (irritable bowel syndrome)    ??? Fibromyalgia    ??? Chronic gastritis    ??? Thyromegaly    ??? Hyperlipidemia 04/14/12   ??? Osteopenia 04/14/12     hip and spine     Past Surgical History   Procedure Laterality Date   ??? Hc nerve location stimulator  03/2001     vagus nerve stimulator, per patient   ??? Excision of salivary gland     ??? Hx cholecystectomy     ??? Hx salpingo-oophorectomy       left   ??? Hx colectomy       low anterior resection   ??? Hx tubal ligation     ??? Hx cyst removal     ??? Hx hemorrhoidectomy     ??? Hx shoulder arthroscopy       right rotator cuff repair       Family History:  family history includes Alcohol abuse in her father; Cancer in her maternal grandfather and maternal grandmother; and Osteoporosis in her mother.    Medications:  Current Outpatient Prescriptions   Medication Sig Dispense Refill   ??? Cholecalciferol, Vitamin D3, (VITAMIN D3) 1,000 unit cap Take  by mouth.       ???  amitriptyline (ELAVIL) 25 mg tablet Take 1 Tab by mouth nightly.  30 Tab  5   ??? omeprazole (PRILOSEC) 20 mg capsule Take 1 Cap by mouth daily as needed. Indications: GASTROESOPHAGEAL REFLUX  30 Cap  5   ??? levothyroxine (SYNTHROID) 50 mcg tablet Take 1 Tab by mouth Daily (before breakfast).  30 Tab  5   ??? zolpidem (AMBIEN) 10 mg tablet Take  by mouth nightly as needed for Sleep.            Allergies:  Allergies   Allergen Reactions   ??? Dilantin (Phenytoin Sodium Extended) Itching   ??? Depakote (Divalproex) Hives       Social History:   History   Smoking status   ??? Former Smoker   Smokeless tobacco   ??? Never Used     History   Alcohol Use   ??? Yes     Comment: once a year     History   Drug  Use No       Review of Systems:  General:  No fevers, chills, night sweats, lymph node enlargement, or unintentional weight loss.  HENT:  No ear pain, ear discharge, hearing loss, sinus problems, nose bleeds, allergies, sore throats, gum bleeding, face pain.  Eyes:  No pain, blurred vision, double vision, wears glasses for distance and reading  Pulmonary: no wheeze, cough, shortness of breath, no pain  Heart:  No chest pain, palpitations, dyspnea, hypertension, or high cholesterol  GI:  No abdominal pain, nausea, vomiting, heartburn, dysphagia, change in bowel habits (has a history of IBS and she oscillates bewteen constipation and diarrhea), no hematochezia or black stools  GU:  No urgency, frequency, dysuria, + stress urinary incontinence; has once per night nocturia  Neurology:  No dizziness, numbness/tingling, weakness; no recent seizure activity  Skin:  No rashes, abnormal moles or hives  Musculoskeletal:  No back pain, neck pain, + knee joint pain  Psych:  No depression, anxiety; still grieving death of son 3 years ago (but is better)      Physical Exam:  Filed Vitals:    12/08/12 1139   BP: 130/80   Pulse: 84   Height: 5\' 6"  (1.676 m)   Weight: 178 lb 8 oz (80.967 kg)     Body mass index is 28.82 kg/(m^2).    HEAD: WNL, NCAT,  PERRL, EOMI, no conj erythema, no jaundice, FUNDI-normal  EARS: WNL, no cerumen, no otitis media, no otitis external  NOSE: WNL, no polyps, no bleeding, no erythema  MOUTH: WNL, no erythema, no exudates, no enlarged tonsils  NECK: WNL, no bruit, no masses, bilateral thyromegaly, no nodes, no JVD; well-healed right neck scar  HEART: WNL, RRR: normal S1, S2: no S3 or S4; no rubs or murmurs  LUNGS: WNL, resonant, clear, no wheezing, no ronchi, no rales  ABD: WNL, no hepatomegaly, splenomegaly, guarding, rebound, or tenderness  FEMALE GENITALIA: deferred  BREAST: deferred  BACK: WNL, no CVA-T, spinous tendernous, or spasm  EXT: no clubbing, cyanosis, or edema; 2+ radial pulses, 2+ dorsalis pedis pulses, 2+ posterior tibialis pulses  SKIN: no rash, nodules, or cysts; few non-suspicious moles  NEURO: WNL, CN III-XII normal, sensory intact to light touch, motor 5/5 bilaterally, reflexes 2+/2+  PSYCH: AFFECT-normal, ORIENTATION- normal to person, place, and time    LABS: 12/02/12 [facility])  PT 12.4  INR 0.9  APTT 27.7  CBC normal  CMP normal  UA normal    ECG: done 12/02/12 at [facility]  NSR, rate 70, axis 0, intervals normal; morphology: normal P, RSr' in v1 and v2 (consistent with incomplete RBBB), ST normal,  T wave inversion in III.  Impression: NSR, rate 70.  Incomplete RBBB.  No significant change when compared to ECG from 01/22/11.    Impression/Plan:     ICD-9-CM   1. Epilepsy: No seizure activity.  Controlled with VNS.  Now with need for VNS implantable pulse generator change scheduled for December 24, 2012 at [facility]. 345.90   2. Hyperlipidemia: cholesterol was high in October of 2013.  She will need to repeat the lipid panel after her surgery.  She has changed her diet in the recent past.  This may have had some impact on her cholesterol. 272.4   3. IBS (irritable bowel syndrome): patient with constipation and diarrhea.  No significant change in symptoms and the symptoms are not limiting  her in any way.  She hopes that her dietary changes with  prove helpful for her IBS. 564.1   3. Hypothyroidism:  Patient should continue with Levoxyl dose and repeat TSH level. 244.9   4. Thyroid nodules: bilateral thyroid nodules; right predominant nodule biopsied 11/13.  She should have a repeat thyroid US in November. 241.1   5. Pre-operative exam: see below V72.84       The patient is currently well.  No history of anesthesia complications.  Adequate functional capacity.  The exam was normal.  Labs and ECG were normal or unchanged.  The patient is at low risk for cardiovascular and pulmonary complications.  There are no contraindications to the proposed surgery.  She may take her Synthroid on the morning of surgery with a small sip of water.    Thank you for the courtesy of this consultation.      Follow-up as needed or in 3-6 months      Carolin Sicks, MD   12/08/2012

## 2012-12-23 MED ORDER — AZITHROMYCIN 250 MG TAB
250 mg | ORAL_TABLET | ORAL | Status: DC
Start: 2012-12-23 — End: 2013-01-13

## 2012-12-23 NOTE — Progress Notes (Signed)
HISTORY OF PRESENT ILLNESS  Vanessa Wilson is a 57 y.o. female.  Blood pressure 134/88, pulse 76, temperature 98.9 ??F (37.2 ??C), temperature source Oral, resp. rate 14, height 5\' 6"  (1.676 m), weight 178 lb (80.74 kg), SpO2 97.00%.    HPI Comments: Sinus congestion x 3-4 days.  Throat clearing cough.  Noted that her sinuses cleared somewhat at work today but on the way here filled back up.  Cannot breathe through her nose.  Also starting to feel achy.  Denies seasonal allergies but does admit that once a year has similar symptoms that usually respond to antihistamine.    Is scheduled for surgery tomorrow to replace battery in her VNS.     Cold Symptoms  The history is provided by the patient. This is a new problem. Episode onset: 3-4 days. The problem occurs constantly. There has been no fever. Associated symptoms include ear congestion and rhinorrhea. Pertinent negatives include no chest pain, no chills, no eye redness, no ear pain, no headaches, no sore throat, no shortness of breath and no wheezing. Treatments tried: antihistamines. The treatment provided mild relief.     Past Medical History   Diagnosis Date   ??? Diverticulitis 12/10   ??? Epilepsy    ??? IBS (irritable bowel syndrome)    ??? Fibromyalgia    ??? Chronic gastritis    ??? Multiple thyroid nodules      s/p [place] and biopsy of dominant nodule 11/13   ??? Hyperlipidemia 04/14/12   ??? Osteopenia 04/14/12     hip and spine   ??? Hypothyroidism    ??? H/O mammogram 12/16/12     benign; repeat 1 year.     Past Surgical History   Procedure Laterality Date   ??? Hc nerve location stimulator  03/2001     vagus nerve stimulator, per patient   ??? Excision of salivary gland     ??? Hx cholecystectomy     ??? Hx salpingo-oophorectomy       left   ??? Hx colectomy       low anterior resection   ??? Hx tubal ligation     ??? Hx cyst removal     ??? Hx hemorrhoidectomy     ??? Hx shoulder arthroscopy       right rotator cuff repair     Family History   Problem Relation Age of Onset   ??? Osteoporosis Mother     ??? Alcohol abuse Father    ??? Cancer Maternal Grandmother      lung   ??? Cancer Maternal Grandfather      Allergies   Allergen Reactions   ??? Dilantin (Phenytoin Sodium Extended) Itching   ??? Depakote (Divalproex) Hives     Current Outpatient Prescriptions   Medication Sig Dispense Refill   ??? azithromycin (ZITHROMAX Z-PAK) 250 mg tablet Take two tablets today then one tablet daily  6 Tab  0   ??? Cholecalciferol, Vitamin D3, (VITAMIN D3) 1,000 unit cap Take  by mouth.       ??? amitriptyline (ELAVIL) 25 mg tablet Take 1 Tab by mouth nightly.  30 Tab  5   ??? omeprazole (PRILOSEC) 20 mg capsule Take 1 Cap by mouth daily as needed. Indications: GASTROESOPHAGEAL REFLUX  30 Cap  5   ??? levothyroxine (SYNTHROID) 50 mcg tablet Take 1 Tab by mouth Daily (before breakfast).  30 Tab  5   ??? zolpidem (AMBIEN) 10 mg tablet Take  by mouth nightly as needed for  Sleep.          Patient Active Problem List   Diagnosis Code   ??? Depression 311   ??? IBS (irritable bowel syndrome) 564.1   ??? Fibromyalgia 729.1   ??? Epilepsy 345.90   ??? Insomnia 780.52   ??? Hyperlipidemia 272.4   ??? Osteopenia 733.90   ??? Multiple thyroid nodules 241.1       Filed Vitals:    12/23/12 1310   BP: 134/88   Pulse: 76   Temp: 98.9 ??F (37.2 ??C)   TempSrc: Oral   Resp: 14   Height: 5\' 6"  (1.676 m)   Weight: 178 lb (80.74 kg)   SpO2: 97%     Body mass index is 28.74 kg/(m^2).      Review of Systems   Constitutional: Negative for fever, chills and malaise/fatigue.   HENT: Positive for congestion and rhinorrhea. Negative for ear pain and sore throat.    Eyes: Negative.  Negative for redness.   Respiratory: Positive for cough. Negative for shortness of breath and wheezing.    Cardiovascular: Negative for chest pain and palpitations.   Skin: Negative.    Neurological: Negative for dizziness and headaches.   Endo/Heme/Allergies: Positive for environmental allergies.   All other systems reviewed and are negative.        Physical Exam   Vitals reviewed.  Constitutional: She is  oriented to person, place, and time. She appears well-developed and well-nourished. No distress.   (++) nasal voice   HENT:   Head: Normocephalic and atraumatic.   Right Ear: External ear normal.   Left Ear: External ear normal.   Nose: Nose normal.   Mouth/Throat: Oropharynx is clear and moist. No oropharyngeal exudate.   Eyes: Conjunctivae are normal. Pupils are equal, round, and reactive to light.   Neck: Normal range of motion. Neck supple.   Cardiovascular: Normal rate, regular rhythm, normal heart sounds and intact distal pulses.  Exam reveals no gallop and no friction rub.    No murmur heard.  Pulmonary/Chest: Effort normal and breath sounds normal. No respiratory distress. She has no wheezes. She has no rales. She exhibits no tenderness.   Lymphadenopathy:     She has no cervical adenopathy.   Neurological: She is alert and oriented to person, place, and time.   Skin: Skin is warm and dry. She is not diaphoretic. No pallor.   Psychiatric: She has a normal mood and affect.       ASSESSMENT and PLAN    ICD-9-CM    1. URI (upper respiratory infection) 465.9 azithromycin (ZITHROMAX Z-PAK) 250 mg tablet   Advised rest, fluids, antihistamine  May be allergy  Advised to contact surgeon regarding scheduled surgery, may have to postpone  Canary Br[place], [place]

## 2012-12-24 NOTE — Progress Notes (Signed)
I have read the encounter note and agree with the current assessment and plan.

## 2013-01-13 NOTE — Patient Instructions (Signed)
Epilepsy: After Your Visit  Your Care Instructions  Epilepsy is a common condition that causes repeated seizures. The seizures are caused by bursts of electrical activity in the brain that aren't normal. Seizures may cause problems with muscle control, movement, speech, vision, or awareness. They can be scary.  Epilepsy affects each person differently. Some people have only a few seizures. Others get them more often. If you know what triggers a seizure, you may be able to avoid having one.  You can take medicines to control and reduce seizures. You and your doctor will need to find the right combination, schedule, and dose of medicine. This may take time and careful changes.  Seizures may get worse and happen more often over time.  Follow-up care is a key part of your treatment and safety. Be sure to make and go to all appointments, and call your doctor if you are having problems. It's also a good idea to know your test results and keep a list of the medicines you take.  How can you care for yourself at home?  ?? Be safe with medicines. Take your medicines exactly as prescribed. Call your doctor if you think you are having a problem with your medicine.  ?? Make a treatment plan with your doctor. Be sure to follow your plan.  ?? Try to identify and avoid things that may make you more likely to have a seizure. These may include:  ?? Not getting enough sleep.  ?? Using drugs or alcohol.  ?? Being emotionally stressed.  ?? Skipping meals.  ?? Keep a record of any seizures you have. Note the date, time of day, and any details about the seizure that you can remember. Your doctor can use this information to plan or adjust your medicine or other treatment.  ?? Be sure that any doctor treating you for another condition knows that you have epilepsy. Each doctor should know what medicines you are taking, if any.  ?? Wear a medical ID bracelet. You can buy this at most drugstores. If you have a seizure that leaves you unconscious or  unable to speak for yourself, this bracelet will let those who are treating you know that you have epilepsy.  ?? Talk to your doctor about whether it is safe for you to do certain activities, such as drive or swim.  When should you call for help?  Call 911 anytime you think you may need emergency care. For example, call if:  ?? A seizure does not stop as it normally does.  ?? You have new symptoms such as:  ?? Numbness, tingling, or weakness on one side of your body or face.  ?? Vision changes.  ?? Trouble speaking or thinking clearly.  Call your doctor now or seek immediate medical care if:  ?? You have a fever.  ?? You have a severe headache.  Watch closely for changes in your health, and be sure to contact your doctor if:  ?? The normal pattern or features of your seizures change.   Where can you learn more?   Go to http://www.healthwise.net/[facility]  Enter X141 in the search box to learn more about "Epilepsy: After Your Visit."   ?? 2006-2014 Healthwise, Inc[employer]. Care instructions adapted under license by [facility] (which disclaims liability or warranty for this information). This care instruction is for use with your licensed healthcare professional. If you have questions about a medical condition or this instruction, always ask your healthcare professional. Healthwise, Incorporated disclaims any warranty   or liability for your use of this information.  Content Version: 10.1.311062; Current as of: August 25, 2012

## 2013-01-13 NOTE — Progress Notes (Signed)
HISTORY OF PRESENT ILLNESS  Vanessa Wilson is a 57 y.o. female.  HPI   Vanessa Wilson is a 57 y.o. year old female who is here for a  medical clearance of vagal nerve stimulator IPG change.   Patient Active Problem List   Diagnosis Code   ??? Depression 311   ??? IBS (irritable bowel syndrome) 564.1   ??? Fibromyalgia 729.1   ??? Epilepsy 345.90   ??? Insomnia 780.52   ??? Hyperlipidemia 272.4   ??? Osteopenia 733.90   ??? Multiple thyroid nodules 241.1   ??? Diverticulitis 562.11   ??? Chronic gastritis 535.10   ??? Hypothyroidism 244.9   ??? H/O mammogram V15.89   ??? CKD (chronic kidney disease) stage 3, GFR 30-59 ml/min 585.3   .    Current complaints: feels fine but needs to control her epilepsy.      Past History:   Past Medical History   Diagnosis Date   ??? Diverticulitis 12/10   ??? Epilepsy    ??? IBS (irritable bowel syndrome)    ??? Fibromyalgia    ??? Chronic gastritis    ??? Multiple thyroid nodules      s/p [place] and biopsy of dominant nodule 11/13   ??? Hyperlipidemia 04/14/12   ??? Osteopenia 04/14/12     hip and spine   ??? Hypothyroidism    ??? H/O mammogram 12/16/12     benign; repeat 1 year.   ??? CKD (chronic kidney disease) stage 3, GFR 30-59 ml/min 01/11/13     EGFR 58      Surgical History:   Past Surgical History   Procedure Laterality Date   ??? Hc nerve location stimulator  03/2001     vagus nerve stimulator, per patient   ??? Excision of salivary gland     ??? Hx cholecystectomy     ??? Hx salpingo-oophorectomy       left   ??? Hx colectomy       low anterior resection   ??? Hx tubal ligation     ??? Hx cyst removal     ??? Hx hemorrhoidectomy     ??? Hx shoulder arthroscopy       right rotator cuff repair     Allergies:   Allergies   Allergen Reactions   ??? Dilantin (Phenytoin Sodium Extended) Itching   ??? Depakote (Divalproex) Hives      Social History:   History     Social History   ??? Marital Status: MARRIED     Spouse Name: N/A     Number of Children: N/A   ??? Years of Education: N/A     Social History Main Topics   ??? Smoking status: Former Smoker   ??? Smokeless  tobacco: Never Used   ??? Alcohol Use: Yes      Comment: once a year   ??? Drug Use: No   ??? Sexually Active: Yes -- Female partner(s)     Other Topics Concern   ??? Not on file     Social History Narrative   ??? No narrative on file      Family History:   Family History   Problem Relation Age of Onset   ??? Osteoporosis Mother    ??? Alcohol abuse Father    ??? Cancer Maternal Grandmother      lung   ??? Cancer Maternal Grandfather       Medications:   Current Outpatient Prescriptions   Medication Sig Dispense Refill   ???  levETIRAcetam (KEPPRA XR) 500 mg ER tablet Take  by mouth daily.       ??? amitriptyline (ELAVIL) 25 mg tablet Take 1 Tab by mouth nightly.  30 Tab  5   ??? omeprazole (PRILOSEC) 20 mg capsule Take 1 Cap by mouth daily as needed. Indications: GASTROESOPHAGEAL REFLUX  30 Cap  5   ??? levothyroxine (SYNTHROID) 50 mcg tablet Take 1 Tab by mouth Daily (before breakfast).  30 Tab  5   ??? zolpidem (AMBIEN) 10 mg tablet Take  by mouth nightly as needed for Sleep.              Screening:   DEXA Not needed   Colonscopy Not needed     Health Maintenance   Topic Date Due   ??? Pap Aka Cervical Cytology  02/10/1979   ??? Fobt Q 1 Year Age 72-75  02/10/2008   ??? Breast Cancer Scrn Mammogram  12/25/2014       Vaccination history:   Immunization History   Administered Date(s) Administered   ??? Influenza, Seasonal, Injectable 03/31/2012   ??? Td (Adult) 10/14/2005        Safety:  Fall Risk: None   Seat Belts:  Yes     Cognitive Impairment: None.        Physical Exam:    Filed Vitals:    01/13/13 1110   BP: 132/82   Pulse: 95   Temp: 97.6 ??F (36.4 ??C)   TempSrc: Oral   Resp: 16   Height: 5\' 6"  (1.676 m)   Weight: 181 lb (82.101 kg)   SpO2: 98%       Patient Care Team:  Carolin Sicks, MD as PCP - General (Internal Medicine)  Carollee Massed, MD (Neurosurgery)    Review of Systems   Constitutional: Negative for fever, chills, weight loss, malaise/fatigue and diaphoresis.   HENT: Negative for hearing loss, ear pain, nosebleeds, congestion, sore throat, neck  pain and ear discharge.    Eyes: Negative for blurred vision, double vision, photophobia, pain, discharge and redness.   Respiratory: Negative for cough, hemoptysis, sputum production, shortness of breath and wheezing.    Cardiovascular: Negative for chest pain, palpitations and leg swelling.   Gastrointestinal: Negative for heartburn, nausea, vomiting, abdominal pain, diarrhea, constipation and blood in stool.   Genitourinary: Negative for dysuria, urgency, frequency, hematuria and flank pain.   Musculoskeletal: Negative for back pain and joint pain.   Skin: Negative for itching and rash.   Neurological: Negative for dizziness, tingling, tremors, sensory change, focal weakness, seizures, loss of consciousness, weakness and headaches.   Endo/Heme/Allergies: Does not bruise/bleed easily.   Psychiatric/Behavioral: Negative for depression and memory loss. The patient is not nervous/anxious.        Physical Exam   Constitutional: She is oriented to person, place, and time. She appears well-developed and well-nourished. No distress.   HENT:   Head: Normocephalic and atraumatic.   Right Ear: External ear normal.   Left Ear: External ear normal.   Nose: Nose normal.   Mouth/Throat: Oropharynx is clear and moist. No oropharyngeal exudate.   Eyes: Conjunctivae and EOM are normal. Pupils are equal, round, and reactive to light. Right eye exhibits no discharge. Left eye exhibits no discharge. No scleral icterus.   Neck: Normal range of motion. Neck supple. No JVD present. No tracheal deviation present. No thyromegaly present.   Cardiovascular: Normal rate, regular rhythm, normal heart sounds and intact distal pulses.  Exam reveals no gallop and no friction  rub.    No murmur heard.  Pulmonary/Chest: Effort normal and breath sounds normal. No stridor. No respiratory distress. She has no wheezes. She has no rales. She exhibits no tenderness.   Abdominal: Soft. Bowel sounds are normal. She exhibits no distension and no mass. There  is no tenderness. There is no rebound and no guarding.   Musculoskeletal: Normal range of motion. She exhibits no edema and no tenderness.   Lymphadenopathy:     She has no cervical adenopathy.   Neurological: She is alert and oriented to person, place, and time. She has normal reflexes.   Skin: Skin is warm and dry. No rash noted. She is not diaphoretic. No erythema. No pallor.   Psychiatric: She has a normal mood and affect.       ASSESSMENT and PLAN    ICD-9-CM   1. Epilepsy 345.90    needs this procedure.   2. Other specified pre-operative examination: No absolute contraindications to the planned procedure at this time. Pt has had a relative normal labs on 01/11/13 at [facility], negative ECG in 06/14, and negative CXR in 06/14. She stopped ASA, NSAIDs, and Vitamins,  and will stop his/her meds before the day of the procedure.   V72.83

## 2013-02-01 MED ORDER — LEVOTHYROXINE 50 MCG TAB
50 mcg | ORAL_TABLET | Freq: Every day | ORAL | Status: DC
Start: 2013-02-01 — End: 2013-08-13

## 2013-03-07 MED ORDER — OMEPRAZOLE 20 MG CAP, DELAYED RELEASE
20 mg | ORAL_CAPSULE | Freq: Every day | ORAL | Status: DC | PRN
Start: 2013-03-07 — End: 2013-07-23

## 2013-03-11 LAB — LIPID PANEL
Cholesterol, total: 195 mg/dL (ref 100–199)
HDL Cholesterol: 65 mg/dL (ref >39)
LDL, calculated: 110 mg/dL — ABNORMAL HIGH (ref 0–99)
Triglyceride: 102 mg/dL (ref 0–149)
VLDL, calculated: 20 mg/dL (ref 5–40)

## 2013-03-11 LAB — TSH 3RD GENERATION: TSH: 2.74 u[IU]/mL (ref 0.450–4.500)

## 2013-03-11 NOTE — Progress Notes (Signed)
Quick Note:    TSh (thyroid function is normal). Cholesterol panel is now normal as well. Continue with current medication dose and diet and exercise for cholesterol control. Please call the patient and let her know.  ______

## 2013-03-23 NOTE — Progress Notes (Signed)
Quick Note:    TSh (thyroid function is normal). Cholesterol panel is now normal as well. Continue with current medication dose and diet and exercise for cholesterol control. Please call the patient and let her know.  ______

## 2013-03-24 NOTE — Progress Notes (Signed)
Pt aware of results

## 2013-05-09 NOTE — Telephone Encounter (Signed)
Last seen 01/13/13 for a med clear with Dr Imogene Burn

## 2013-05-10 MED ORDER — AMITRIPTYLINE 25 MG TAB
25 mg | ORAL_TABLET | Freq: Every evening | ORAL | Status: DC
Start: 2013-05-10 — End: 2013-07-23

## 2013-05-10 NOTE — Telephone Encounter (Signed)
Left message on voice mail Rx was refilled

## 2013-06-16 HISTORY — PX: BREAST IMPLANT REMOVAL: SUR1101

## 2013-07-23 LAB — HVMA POC URINALYSIS DIP STICK AUTO W/O MICRO
Glucose, urine (POC): NEGATIVE
Ketones (POC): NEGATIVE
Nitrites (UA POC): POSITIVE
Specific gravity (UA POC): 1.025 (ref 1.001–1.035)
Urobilinogen (UA POC): 0.2 (ref 0.2–1)
pH (UA POC): 6 (ref 4.6–8.0)

## 2013-07-23 MED ORDER — NITROFURANTOIN (25% MACROCRYSTAL FORM) 100 MG CAP
100 mg | ORAL_CAPSULE | Freq: Two times a day (BID) | ORAL | Status: AC
Start: 2013-07-23 — End: 2013-07-28

## 2013-07-23 MED ORDER — PHENAZOPYRIDINE 200 MG TAB
200 mg | ORAL_TABLET | Freq: Three times a day (TID) | ORAL | Status: AC | PRN
Start: 2013-07-23 — End: 2013-07-25

## 2013-07-23 NOTE — Addendum Note (Signed)
Addended byCoy Saunas: Tammie Yanda on: 07/23/2013 10:17 AM     Modules accepted: Orders

## 2013-07-23 NOTE — Patient Instructions (Addendum)
MyChart Activation    Thank you for requesting access to MyChart. Please follow the instructions below to securely access and download your online medical record. MyChart allows you to send messages to your doctor, view your test results, renew your prescriptions, schedule appointments, and more.    How Do I Sign Up?    1. In your internet browser, go to [email or web address].  2. Click on the First Time User? Click Here link in the Sign In box. You will see the New Member Sign Up page.  3. Enter your MyChart Access Code exactly as it appears below. You will not need to use this code after you???ve completed the sign-up process. If you do not sign up before the expiration date, you must request a new code.    MyChart Access Code: [email or web address]  Expires: 10/21/2013  9:19 AM (This is the date your MyChart access code will expire)    4. Enter the last four digits of your Social Security Number (xxxx) and Date of Birth (mm/dd/yyyy) as indicated and click Submit. You will be taken to the next sign-up page.  5. Create a MyChart ID. This will be your MyChart login ID and cannot be changed, so think of one that is secure and easy to remember.  6. Create a MyChart password. You can change your password at any time.  7. Enter your Password Reset Question and Answer. This can be used at a later time if you forget your password.   8. Enter your e-mail address. You will receive e-mail notification when new information is available in MyChart.  9. Click Sign Up. You can now view and download portions of your medical record.  10. Click the Download Summary menu link to download a portable copy of your medical information.    Additional Information    If you have questions, please visit the Frequently Asked Questions section of the MyChart website at [email or web address] Remember, MyChart is NOT to be used for urgent needs. For medical emergencies, dial 911.      Take the Macrobid 100 mg BID  for 5 days.    You can take the Pyridium 200 mg three times a day for 2 days.    If you are not better come back.

## 2013-07-23 NOTE — Progress Notes (Signed)
Quick Note:    Send for urine culture. UA was positive.  ______

## 2013-07-23 NOTE — Progress Notes (Signed)
CC: UTI symptoms    HPI:  Vanessa Wilson is a 58 y.o. year old female who is here for urinary pressure and dysuria for the last 2 weeks.  She feels like she still has the symptoms despite taking Bactrim for 10 days.  She states that her friend's husband is a doctor and he gave her Bactrim DS for the last 9 days.  She does not feel any better though.  Symptoms started 2 weeks ago.  Have gotten no better.  Main complaint now is pressure and burning.  She has taken Bactrim for the symptoms with little improvement.    Symptoms:  no fevers, no chills, no night sweats, no nasal congestion, no facial pain, no ear pain, no sore throat, no  neck pain, no cough, no joint pains, no muscle aches, no nausea, no vomiting, no diarrhea, no abdominal pain, + dysuria, no urinary frequency, no chest pain,no  shortness of breath, no palpitations, no leg edema, no lightheadedness, no syncope, no back pain.    Other complaints include: She states that she is not able to lose weight, she is eating well but she is not exercising.  She states that she is off of the Ambien and     Past History:   Past Medical History   Diagnosis Date   ??? Diverticulitis 12/10   ??? IBS (irritable bowel syndrome)    ??? Fibromyalgia    ??? Chronic gastritis    ??? Multiple thyroid nodules      s/p [place] and biopsy of dominant nodule 11/13   ??? Hyperlipidemia 04/14/12   ??? Osteopenia 04/14/12     hip and spine   ??? Hypothyroidism    ??? H/O mammogram 12/16/12     benign; repeat 1 year.   ??? CKD (chronic kidney disease) stage 3, GFR 30-59 ml/min 01/11/13     EGFR 58   ??? Epilepsy      complex partial seizures   ??? Reflux esophagitis 05/18/13     EGD: Dr. Josefine Class   ??? Gastritis 05/18/13     with gastric polyps     Allergies:   Allergies   Allergen Reactions   ??? Dilantin [Phenytoin Sodium Extended] Itching   ??? Depakote [Divalproex] Hives     Social History:   History   Smoking status   ??? Former Smoker   Smokeless tobacco   ??? Never Used     History   Drug Use No     History   Alcohol  Use   ??? Yes     Comment: once a year       Medications:   Current Outpatient Prescriptions   Medication Sig Dispense Refill   ??? nitrofurantoin, macrocrystal-monohydrate, (MACROBID) 100 mg capsule Take 1 Cap by mouth two (2) times a day for 5 days.  10 Cap  0   ??? phenazopyridine (PYRIDIUM) 200 mg tablet Take 1 Tab by mouth three (3) times daily as needed for Pain for up to 2 days.  6 Tab  1   ??? levothyroxine (SYNTHROID) 50 mcg tablet Take 1 Tab by mouth Daily (before breakfast).  30 Tab  5   ??? levETIRAcetam (KEPPRA XR) 500 mg ER tablet Take 500 mg by mouth nightly.       ??? JUBLIA sola topical solution Apply  to affected area daily.       ??? pantoprazole (PROTONIX) 40 mg tablet Take 40 mg by mouth daily.    5  Review of Systems:  See HPI    Physical Exam  Vitals:  BP 125/80    Pulse 76    Temp(Src) 98.1 ??F (36.7 ??C)    Ht 5' 6"  (1.676 m)    Wt 183 lb (83.008 kg)    BMI 29.55 kg/m2     Body mass index is 29.55 kg/(m^2).     GEN: awake, alert, no distress  NECK: WNL, no bruit, no masses, no nodes, no JVD  HEART: WNL, RRR: normal S1, S2; no rubs or murmurs  LUNGS: WNL, resonant, clear, no wheezing, no ronchi, no rales  ABD: WNL, soft, non-tender, non-distended, normal bowel sounds; no masses  BACK: WNL, no CVA-T, Spinous tendernous, or spasm  EXT: no clubbing, cyanosis, or edema; 2+ radial pulses    LABS:  Recent Results (from the past 12 hour(s))   [facility] POC URINALYSIS DIP STICK AUTO W/O MICRO    Collection Time     07/23/13  9:35 AM       Result Value Ref Range    Glucose, urine (POC) Negative  Negative    Bilirubin (POC) Small      Ketones (POC) Negative  Negative    Specific gravity (UA POC) 1.025  1.001 - 1.035    Blood (POC) Moderate      pH (UA POC) 6.0  4.6 - 8.0    Protein (POC) Trace  Negative    Urobilinogen (UA POC) 0.2 mg/dL  0.2 - 1    Nitrites (UA POC) Positive  Negative    Leukocyte esterase (POC) Trace      Color (UA POC) Yellow      Clarity (UA POC) Clear         Orders:   Orders Placed This  Encounter   ??? POC URINALYSIS DIP STICK AUTO W/O MICRO   ??? JUBLIA sola topical solution     Sig: Apply  to affected area daily.   ??? pantoprazole (PROTONIX) 40 mg tablet     Sig: Take 40 mg by mouth daily.     Refill:  5   ??? nitrofurantoin, macrocrystal-monohydrate, (MACROBID) 100 mg capsule     Sig: Take 1 Cap by mouth two (2) times a day for 5 days.     Dispense:  10 Cap     Refill:  0   ??? phenazopyridine (PYRIDIUM) 200 mg tablet     Sig: Take 1 Tab by mouth three (3) times daily as needed for Pain for up to 2 days.     Dispense:  6 Tab     Refill:  1       Assessment/Plan:     ICD-9-CM    1. UTI (lower urinary tract infection): UA is positive and she is on Bactrim, but will send for culture anyway.  She will start Macrobid 100 mg BID for 5 days.  If her symptoms do not improve will have to repeat the UA and Ucx off of the antibiotics.  She will also take Pyridium 200 mg TID for 2 days for symptom relief. 599.0 HVMA POC URINALYSIS DIP STICK AUTO W/O MICRO       Follow-up if needed     Joan Mayans, MD  07/23/2013

## 2013-07-27 LAB — CULTURE, URINE: Culture Result:: <10000

## 2013-07-27 NOTE — Telephone Encounter (Signed)
I called the patient and left a message for her to call back.  Urine culture is negative. Patient with symptoms though she was on Bactrim prior to having culture done.  If the symptoms persist despite the second round of antibiotics that I gave the patient, then she will need to repeat the UA and UC after being off the antibiotics for at least 3-5 days.

## 2013-07-27 NOTE — Progress Notes (Signed)
Quick Note:    Urine culture is negative. Patient with symptoms though she was on Bactrim prior to having culture done. If the symptoms persist despite the second round of antibiotics that I gave the patient, then she will need to repeat the UA and UC after being off the antibiotics for at least 3-5 days.  ______

## 2013-07-29 NOTE — Telephone Encounter (Signed)
Patient aware of the results of the urine culture.  She states that she finished the macrobid on Wednesday but she is still feeling pressure when she urinates and she is also getting a lot of leg cramping at night.  She should come to the office on Monday and leave another urine for UA and UC on Monday.  I advised that she drink plenty of water (to her thirst) or drink Gatorade or eat plenty of fruits and vegetables.  She should also stretch before she goes to bed to help prevent the cramping.

## 2013-08-01 NOTE — Telephone Encounter (Signed)
Left message for pt to call back

## 2013-08-13 NOTE — Telephone Encounter (Signed)
Pt was here 07/27/13 and has app September 14, 2013

## 2013-08-16 MED ORDER — LEVOTHYROXINE 50 MCG TAB
50 mcg | ORAL_TABLET | Freq: Every day | ORAL | Status: DC
Start: 2013-08-16 — End: 2013-09-14

## 2013-08-16 NOTE — Telephone Encounter (Signed)
Refill sent to pharmacy.

## 2013-09-14 MED ORDER — LEVOTHYROXINE 50 MCG TAB
50 mcg | ORAL_TABLET | Freq: Every day | ORAL | Status: DC
Start: 2013-09-14 — End: 2013-12-15

## 2013-09-14 NOTE — Patient Instructions (Signed)
Preventative Measures:   Safety- Safety reviewed; no drunk driving, no texting/phone holding while driving, always use seatbelts and safety equipment  Sunscreen- Reviewed sunscreen use, at least SPF 15 any time of year, if outside more than 5 minutes  Detectors: Patient has smoke detector and carbon monoxide detector  Diet and Exercise: Reviewed a healthy diet and recommended regular exercise 3+ times per week, 30+ minutes per episode  Self exams: Reviewed self breast exam monthly  MMG done 7/14, due 7/15  Gynecology/PAP done 4/14, due 4/15  Screening colonoscopy due in 1-2 years  DEXA done ?, due soon  Vaccinations- tetanus due 2017, shingles vaccine at 60, Pneumovax/Prevnar at 65, recommend flu vaccine in fall    Follow-up as needed or in 6 months

## 2013-09-14 NOTE — Progress Notes (Signed)
[facility] Annual Wellness Visit    Date: 09/14/2013    Name: Vanessa Wilson       DOB: [birth year]     History of Present Illness: Vanessa Wilson is a 58 y.o. year old female who is here for annual exam.  She states that she is mentally doing well.  She has started doing exercise (weighted hula-hoop).    She states that she has stomach issues; she states that she has bloating and abdominal pain.  She has gone off of dairy and she is going to try to go off of wheat products.  She has felt better off dairy products.    She also has a puffiness in between the knuckles on her hands.  She does not have pain or joint swelling.    She also has many cysts in her hands and on her forehead.  She has had one removed in the past and there is one in the area where she had it removed in the past.    She also has a varicose vein in the back of her right knee.  It is not painful.  She just does not like the way it looks.     She also has left posterior leg pain in the distal left thigh.  She states that the pain comes on at rest perhaps after a day at work.  Also can happen when she is on long car rides.  The pain started last year; it is intermittent, Intensity is a 6/10 at its maximum.  She states that it goes away after 15-20 minutes on its own.       Exercise: weighted hula hoop daily for 10 minutes and tread-climber every other day for the last few days     Diet: changing things up.  She went off of dairy about 1 week ago and feels better.      Living will: no    Past Medical History/Surgical History:  Past Medical History   Diagnosis Date   ??? Diverticulitis 12/10   ??? IBS (irritable bowel syndrome)    ??? Fibromyalgia    ??? Chronic gastritis    ??? Multiple thyroid nodules      s/p [place] and biopsy of dominant nodule 11/13   ??? Hyperlipidemia 04/14/12   ??? Osteopenia 04/14/12     hip and spine   ??? Hypothyroidism    ??? H/O mammogram 12/16/12     benign; repeat 1 year.   ??? CKD (chronic kidney disease) stage 3, GFR 30-59 ml/min 01/11/13     EGFR 58   ???  Epilepsy      complex partial seizures   ??? Reflux esophagitis 05/18/13     EGD: Dr. Josefine Class   ??? Gastritis 05/18/13     with gastric polyps     Past Surgical History   Procedure Laterality Date   ??? Hc nerve location stimulator  03/2001 & 01/2013     vagus nerve stimulator, per patient   ??? Excision of salivary gland     ??? Hx cholecystectomy     ??? Hx salpingo-oophorectomy       left   ??? Hx colectomy       low anterior resection   ??? Hx tubal ligation     ??? Hx cyst removal     ??? Hx hemorrhoidectomy     ??? Hx shoulder arthroscopy       right rotator cuff repair       Family History:  family history includes Alcohol abuse in her father; Cancer in her maternal grandfather and maternal grandmother; Osteoporosis in her mother.    Medications:  Current Outpatient Prescriptions   Medication Sig Dispense Refill   ??? levothyroxine (SYNTHROID) 50 mcg tablet Take 1 Tab by mouth Daily (before breakfast).  90 Tab  3   ??? pantoprazole (PROTONIX) 40 mg tablet Take 40 mg by mouth daily.    5   ??? levETIRAcetam (KEPPRA XR) 500 mg ER tablet Take 500 mg by mouth nightly.            Allergies:  Allergies   Allergen Reactions   ??? Dilantin [Phenytoin Sodium Extended] Itching   ??? Depakote [Divalproex] Hives       Social History:   History   Smoking status   ??? Former Smoker   Smokeless tobacco   ??? Never Used     History   Alcohol Use   ??? Yes     Comment: once a year     History   Drug Use No       Review of Systems:  General:  No fevers, chills, night sweats, lymph node enlargement, or unintentional weight loss.  HENT:  No ear pain, ear discharge, hearing loss, sinus problems, nose bleeds, allergies, sore throats, gum bleeding, face pain.  Eyes:  No pain, blurred vision, double vision, wears glasses for distance and reading  Pulmonary: no wheeze, cough, shortness of breath, no pain  Heart:  No chest pain, palpitations, dyspnea, hypertension, or high cholesterol  GI:  + abdominal pain (epigastric with bloating), no nausea, vomiting, heartburn,  dysphagia, change in bowel habits, hematochezia or black stools  GU:  No urgency, frequency, dysuria, incontinence  Neurology:  No dizziness, numbness/tingling, weakness, + seizure history and she had an episode last Thursday were she does not remember a whole morning's worth of events  Skin:  No rashes, abnormal moles or hives; + cysts in her scalp and hands  Musculoskeletal:  No back pain, neck pain, or joint pain; + left thigh pain  Psych:  + history of depression, anxiety      Screening:  Health Maintenance   Topic Date Due   ??? TDAP AGE > 18  Due 2017   ??? PAP AKA CERVICAL CYTOLOGY 4/14 4/15   ??? FOBT Q 1 YEAR AGE 35-75 : colonoscopy done 4 years ago 02/10/2008   ??? INFLUENZA AGE 33 TO ADULT  01/14/2013   ??? BREAST CANCER SCRN MAMMOGRAM 7/14 12/24/2013   ??? Td Q 10 Yrs Age > 18  10/15/2015       Vaccinations:    Immunization History   Administered Date(s) Administered   ??? Influenza Vaccine 03/31/2012, 03/16/2013   ??? Td 10/14/2005        Physical Exam:  Filed Vitals:    09/14/13 1218   BP: 130/76   Pulse: 80   Height: 5' 6"  (1.676 m)   Weight: 181 lb (82.101 kg)     Body mass index is 29.23 kg/(m^2).    GEN: awake, alert, no distress  HEAD: WNL, NCAT, PERRL, EOMI, no conj erythema, no jaundice, FUNDI-normal  EARS: WNL, no cerumen, no otitis media, no otitis externa  NOSE: WNL, no polyps, no bleeding, no erythema  MOUTH: WNL, no erythema, no exudates, no enlarged tonsils  NECK: left sterno-cleidomastoid prominence from VNS, no bruit, no masses, no thyromegaly, no nodes, no JVD  HEART: WNL, RRR: normal S1, S2: no S3 or S4; no rubs or  murmurs  LUNGS: WNL, resonant, clear, no wheezing, no ronchi, no rales  ABD: WNL, no hepatomegaly, splenomegaly, guarding, rebound, or tenderness  BREAST: deferred  BACK: WNL, no CVA-T, spinous tenderness, or spasm  EXT: no clubbing, cyanosis, or edema; 2+ radial pulses, 2+ dorsalis pedis pulses, 2+ posterior tibialis pulses; patient with spider veins on the right posterior proximal lower  leg as well as on the left thigh  SKIN: no rash, nodules, + right dorsal lateral subcutaneous cyst; left dorsal wrist subcutaneous cyst midline frontal scalp with 1 cm subcutaneous flesh-colored mass; mulitple non-suspicious moles  NEURO: WNL, CN III-XII normal, sensory intact to light touch, motor 5/5 bilaterally, reflexes 2+/2+ in the UE and absent in the ankles  PSYCH: AFFECT-normal, ORIENTATION- normal to person, place, and time    LABS: No results found for this or any previous visit (from the past 24 hour(s)).    ECG: N/A    Impression/Plan:     ICD-9-CM    1. Annual physical exam: see below V70.0    2. Epilepsy: patient with VNS and on oral Keprra.  She states that she has on occasion had amnestic episodes.  The last one occurred last week.  She is scheduled to have an EEG this month.  She will follow-up with neurology. I have cautioned her against driving. 345.90    3. Insomnia; she states that because of these amnestic episodes she has stopped her Ambien and all other medications or supplements that she could. She is not sleeping well but she has gotten a new pillow and some oils to try to help her sleep at night. 780.52    4. Chronic gastritis: patient with recent EGD showing gastritis.  She is on Protonix now but she is still having abdominal pain and bloating.  She has stopped dairy and is planning on stopping wheat to see if it will help her.  She is planning on following up with Dr. Josefine Class. 535.10 CBC WITH AUTOMATED DIFF   5. Hypothyroidism: due for TSH. Continue with levothyroxine. 829.5 METABOLIC PANEL, COMPREHENSIVE     TSH, 3RD GENERATION     levothyroxine (SYNTHROID) 50 mcg tablet   6. Multiple thyroid nodules: due for thyroid US. 241.1 TSH, 3RD GENERATION     US THYROID/PARATHYROID/SOFT TISS   7. Hyperlipidemia: not on medication; she will repeat the lipid panel. 621.3 METABOLIC PANEL, COMPREHENSIVE     LIPID PANEL   8. Varicose veins: patient with spider veins bilateral lower legs;  referral for vascular surgeon given to the patient. 454.9 REFERRAL TO VASCULAR SURGERY   9. Dermoid cyst of scalp: referral to dermatology; but might need general surgery. 216.4 REFERRAL TO DERMATOLOGY         Preventative Measures:   Safety- Safety reviewed; no drunk driving, no texting/phone holding while driving, always use seatbelts and safety equipment  Sunscreen- Reviewed sunscreen use, at least SPF 15 any time of year, if outside more than 5 minutes  Detectors: Patient has smoke detector and carbon monoxide detector  Diet and Exercise: Reviewed a healthy diet and recommended regular exercise 3+ times per week, 30+ minutes per episode  Self exams: Reviewed self breast exam monthly  MMG done 7/14, due 7/15  Gynecology/PAP done 4/14, due 4/15  Screening colonoscopy due in 1-2 years  DEXA done ?, due soon  Vaccinations- tetanus due 2017, shingles vaccine at 60, Pneumovax/Prevnar at 19, recommend flu vaccine in fall    Follow-up as needed or in 6 months  Joan Mayans, MD   09/14/2013

## 2013-09-27 LAB — CBC WITH AUTOMATED DIFF
ABS. BASOPHILS: 0 10*3/uL (ref 0.0–0.2)
ABS. EOSINOPHILS: 0.1 10*3/uL (ref 0.0–0.4)
ABS. IMM. GRANS.: 0 10*3/uL (ref 0.0–0.1)
ABS. MONOCYTES: 0.5 10*3/uL (ref 0.1–0.9)
ABS. NEUTROPHILS: 3.1 10*3/uL (ref 1.4–7.0)
Abs Lymphocytes: 2.3 10*3/uL (ref 0.7–3.1)
BASOPHILS: 1 %
EOSINOPHILS: 1 %
HCT: 41.4 % (ref 34.0–46.6)
HGB: 14.3 g/dL (ref 11.1–15.9)
IMMATURE GRANULOCYTES: 0 %
Lymphocytes: 38 %
MCH: 31.4 pg (ref 26.6–33.0)
MCHC: 34.5 g/dL (ref 31.5–35.7)
MCV: 91 fL (ref 79–97)
MONOCYTES: 9 %
NEUTROPHILS: 51 %
PLATELET: 267 10*3/uL (ref 150–379)
RBC: 4.55 x10E6/uL (ref 3.77–5.28)
RDW: 13.6 % (ref 12.3–15.4)
WBC: 6 10*3/uL (ref 3.4–10.8)

## 2013-09-27 LAB — TSH 3RD GENERATION: TSH: 1.9 u[IU]/mL (ref 0.450–4.500)

## 2013-09-27 LAB — LIPID PANEL
Cholesterol, total: 185 mg/dL (ref 100–199)
HDL Cholesterol: 62 mg/dL (ref >39)
LDL, calculated: 107 mg/dL — ABNORMAL HIGH (ref 0–99)
Triglyceride: 82 mg/dL (ref 0–149)
VLDL, calculated: 16 mg/dL (ref 5–40)

## 2013-09-27 LAB — METABOLIC PANEL, COMPREHENSIVE
A-G Ratio: 1.6 (ref 1.1–2.5)
ALT (SGPT): 24 IU/L (ref 0–32)
AST (SGOT): 26 IU/L (ref 0–40)
Albumin: 4.4 g/dL (ref 3.5–5.5)
Alk. phosphatase: 73 IU/L (ref 39–117)
BUN/Creatinine ratio: 14 (ref 9–23)
BUN: 14 mg/dL (ref 6–24)
Bilirubin, total: 0.4 mg/dL (ref 0.0–1.2)
CO2: 22 mmol/L (ref 18–29)
Calcium: 9.4 mg/dL (ref 8.7–10.2)
Chloride: 104 mmol/L (ref 97–108)
Creatinine: 0.99 mg/dL (ref 0.57–1.00)
GFR est AA: 74 mL/min/{1.73_m2} (ref >59)
GFR est non-AA: 64 mL/min/{1.73_m2} (ref >59)
GLOBULIN, TOTAL: 2.8 g/dL (ref 1.5–4.5)
Glucose: 91 mg/dL (ref 65–99)
Potassium: 4.4 mmol/L (ref 3.5–5.2)
Protein, total: 7.2 g/dL (ref 6.0–8.5)
Sodium: 141 mmol/L (ref 134–144)

## 2013-09-28 NOTE — Progress Notes (Signed)
Quick Note:    Normal CBC, CMP, LIPID, TSH. Please call the patient and let her know that the labs are normal  ______

## 2013-10-03 NOTE — Telephone Encounter (Signed)
Left message for pt to call back for results, please refer back to Dr. Millos notes

## 2013-10-08 MED ORDER — IBUPROFEN 600 MG TAB
600 mg | ORAL_TABLET | Freq: Four times a day (QID) | ORAL | Status: DC | PRN
Start: 2013-10-08 — End: 2014-03-23

## 2013-10-08 MED ORDER — CEPHALEXIN 500 MG CAP
500 mg | ORAL_CAPSULE | Freq: Three times a day (TID) | ORAL | Status: AC
Start: 2013-10-08 — End: 2013-10-15

## 2013-10-08 MED ORDER — SODIUM BICARBONATE 4 % IV
4 % | Freq: Once | INTRAVENOUS | Status: DC
Start: 2013-10-08 — End: 2013-10-08

## 2013-10-08 MED ORDER — IBUPROFEN 600 MG TAB
600 mg | ORAL | Status: AC
Start: 2013-10-08 — End: 2013-10-08
  Administered 2013-10-08: 23:00:00 via ORAL

## 2013-10-08 MED ORDER — CEPHALEXIN 250 MG CAP
250 mg | ORAL | Status: AC
Start: 2013-10-08 — End: 2013-10-08
  Administered 2013-10-08: 23:00:00 via ORAL

## 2013-10-08 MED ORDER — LIDOCAINE HCL 1 % (10 MG/ML) IJ SOLN
10 mg/mL (1 %) | INTRAMUSCULAR | Status: DC
Start: 2013-10-08 — End: 2013-10-08

## 2013-10-08 MED FILL — CEPHALEXIN 250 MG CAP: 250 mg | ORAL | Qty: 2

## 2013-10-08 MED FILL — NEUT 4 % INTRAVENOUS SOLUTION: 4 % | INTRAVENOUS | Qty: 1

## 2013-10-08 MED FILL — IBUPROFEN 600 MG TAB: 600 mg | ORAL | Qty: 1

## 2013-10-08 MED FILL — LIDOCAINE HCL 1 % (10 MG/ML) IJ SOLN: 10 mg/mL (1 %) | INTRAMUSCULAR | Qty: 20

## 2013-10-08 NOTE — ED Notes (Signed)
Pt discharged by NP

## 2013-10-08 NOTE — ED Provider Notes (Addendum)
HPI Comments:   6:22 PM: Vanessa Wilson is a 58 y.o. 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   She fell directly onto the elbow, did not hit her head. Had no LOC, no other complaints of pain. UTD with tetanus booster.     The history is provided by the patient.        Past Medical History   Diagnosis Date   ??? Diverticulitis 12/10   ??? IBS (irritable bowel syndrome)    ??? Fibromyalgia    ??? Chronic gastritis    ??? Multiple thyroid nodules      s/p [place] and biopsy of dominant nodule 11/13   ??? Hyperlipidemia 04/14/12   ??? Osteopenia 04/14/12     hip and spine   ??? Hypothyroidism    ??? H/O mammogram 12/16/12     benign; repeat 1 year.   ??? CKD (chronic kidney disease) stage 3, GFR 30-59 ml/min 01/11/13     EGFR 58   ??? Epilepsy ([place])      complex partial seizures   ??? Reflux esophagitis 05/18/13     EGD: Dr. Josefine Class   ??? Gastritis 05/18/13     with gastric polyps        Past Surgical History   Procedure Laterality Date   ??? Hc nerve location stimulator  03/2001 & 01/2013     vagus nerve stimulator, per patient   ??? Excision of salivary gland     ??? Hx cholecystectomy     ??? Hx salpingo-oophorectomy       left   ??? Hx colectomy       low anterior resection   ??? Hx tubal ligation     ??? Hx cyst removal     ??? Hx hemorrhoidectomy     ??? Hx shoulder arthroscopy       right rotator cuff repair         Family History   Problem Relation Age of Onset   ??? Osteoporosis Mother    ??? Alcohol abuse Father    ??? Cancer Maternal Grandmother      lung   ??? Cancer Maternal Grandfather         History     Social History   ??? Marital Status: MARRIED     Spouse Name: N/A     Number of Children: N/A   ??? Years of Education: N/A     Occupational History   ??? Not on file.     Social History Main  Topics   ??? Smoking status: Former Smoker   ??? Smokeless tobacco: Never Used   ??? Alcohol Use: Yes      Comment: once a year   ??? Drug Use: No   ??? Sexual Activity:     Partners: Male     Other Topics Concern   ??? Not on file     Social History Narrative                  ALLERGIES: Dilantin and Depakote      Review of Systems   Cardiovascular: Negative for chest pain.   Gastrointestinal: Negative for abdominal pain.   Musculoskeletal: Positive for arthralgias. Negative for neck pain.        Right  elbow pain    Skin: Positive for wound.        Right elbow laceration    All other systems reviewed and are negative.      Filed Vitals:    10/08/13 1803   BP: 135/85   Pulse: 82   Temp: 97.3 ??F (36.3 ??C)   Resp: 16   Height: 5' 6"  (1.676 m)   Weight: 77.111 kg (170 lb)   SpO2: 97%            Physical Exam   Constitutional: She is oriented to person, place, and time. She appears well-developed and well-nourished.   HENT:   Head: Normocephalic.   Eyes: Conjunctivae and EOM are normal.   Neck: Normal range of motion. Neck supple.   Cardiovascular: Normal rate and intact distal pulses.    Pulmonary/Chest: Effort normal. No respiratory distress.   Musculoskeletal: Normal range of motion.        Right elbow: She exhibits laceration. She exhibits normal range of motion, no swelling and no effusion. Tenderness found. No medial epicondyle tenderness noted.   Neurological: She is alert and oriented to person, place, and time.   Skin: Skin is warm and dry.   Psychiatric: She has a normal mood and affect. Her behavior is normal.   Nursing note and vitals reviewed.       MDM  Number of Diagnoses or Management Options  Avulsion fracture: minor  Laceration of elbow, right, initial encounter: minor     Amount and/or Complexity of Data Reviewed  Tests in the radiology section of CPT??: ordered and reviewed  Obtain history from someone other than the patient: yes  Review and summarize past medical records: yes  Independent visualization of images,  tracings, or specimens: yes    Risk of Complications, Morbidity, and/or Mortality  Presenting problems: low  Diagnostic procedures: low  Management options: low    Patient Progress  Patient progress: stable      Wound Repair  Date/Time: 10/08/2013 7:00 PM  Performed by: NPPreparation: skin prepped with Shur-Clens and sterile field established  Pre-procedure re-eval: Immediately prior to the procedure, the patient was reevaluated and found suitable for the planned procedure and any planned medications.  Location details: right elbow  Wound length:2.5 cm or less  Anesthesia: local infiltration  Local anesthetic: lidocaine 1% without epinephrine and NaHCO3 (sodium bicarbonate)  Anesthetic total: 5 ml  Foreign bodies: no foreign bodies  Irrigation solution: saline  Irrigation method: jet lavage  Debridement: none  Skin closure: 4-0 nylon  Number of sutures: 4  Technique: simple and interrupted  Approximation: close  Dressing: antibiotic ointment and non-adhesive packing strip  Patient tolerance: Patient tolerated the procedure well with no immediate complications  My total time at bedside, performing this procedure was 1-15 minutes.        <EMERGENCY DEPARTMENT CASE SUMMARY>    Impression/Differential Diagnosis: 1. Fall 2. Right elbow contusion vs fracture 3. Right elbow laceration     Plan: discharge to home, follow up with primary provider or ED in 1 week for suture removal and ortho for further evaluation of right elbow avulsion fracture vs contusion with bone spur     ED Course: imaging, tetanus prophylaxis, pain management, wound care, first dose of antibiotic, discharge to home, work excuse provided. Recommendation for f/u with PCP and ortho     Final Impression/Diagnosis: 1. Fall 2. Right elbow avulsion fracture 3. Right elbow laceration     Patient condition at time of disposition:  stable      I have reviewed the following home medications:    Prior to Admission medications    Medication Sig Start Date End Date  Taking? Authorizing Provider   ibuprofen (MOTRIN) 600 mg tablet Take 1 Tab by mouth every six (6) hours as needed for Pain. 11/27/41  Yes Kirt Boys, NP   cephALEXin (KEFLEX) 500 mg capsule Take 1 Cap by mouth three (3) times daily for 7 days. 1/54/00 01/20/75 Yes Kirt Boys, NP   levothyroxine (SYNTHROID) 50 mcg tablet Take 1 Tab by mouth Daily (before breakfast). 09/14/13  Yes Rosana Millos, MD   levETIRAcetam (KEPPRA XR) 500 mg ER tablet Take 500 mg by mouth nightly.   Yes Historical Provider   pantoprazole (PROTONIX) 40 mg tablet Take 40 mg by mouth daily. 07/11/13   Historical Provider         Kirt Boys, NP    I was personally available for consultation in the emergency department.  I have reviewed the chart and agree with the documentation recorded by the [facility], including the assessment, treatment plan, and disposition.  Vergia Alberts, MD

## 2013-10-08 NOTE — ED Notes (Signed)
Pt states she fell down 4 steps of stairs today, denies loc, pt has laceration to right elbow, c/o right elbow pain and recurrent bleeding of right elbow laceration.

## 2013-10-08 NOTE — ED Notes (Signed)
ER NP at pt's bedside for evaluation.

## 2013-10-08 NOTE — ED Notes (Signed)
Verbal shift change report given to Terri RN (oncoming nurse) by Florentina AddisonVita RN (offgoing nurse). Report included the following information SBAR, ED Summary and MAR.

## 2013-10-19 ENCOUNTER — Encounter

## 2013-12-19 MED ORDER — LEVOTHYROXINE 50 MCG TAB
50 mcg | ORAL_TABLET | Freq: Every day | ORAL | Status: DC
Start: 2013-12-19 — End: 2014-12-13

## 2013-12-27 MED ORDER — LORAZEPAM 1 MG TAB
1 mg | ORAL_TABLET | Freq: Three times a day (TID) | ORAL | Status: DC | PRN
Start: 2013-12-27 — End: 2014-05-23

## 2013-12-27 NOTE — Progress Notes (Signed)
Subjective:      HPI:  Vanessa Wilson is a 58 y.o. female who had concerns including Anxiety. and leaking left breast implant.    Past Medical History   Diagnosis Date   ??? Diverticulitis 12/10   ??? IBS (irritable bowel syndrome)    ??? Fibromyalgia    ??? Chronic gastritis    ??? Multiple thyroid nodules      s/p [place] and biopsy of dominant nodule 11/13   ??? Hyperlipidemia 04/14/12   ??? Osteopenia 04/14/12     hip and spine   ??? Hypothyroidism    ??? H/O mammogram 12/16/12     benign; repeat 1 year.   ??? CKD (chronic kidney disease) stage 3, GFR 30-59 ml/min 01/11/13     EGFR 58   ??? Epilepsy ([place])      complex partial seizures   ??? Reflux esophagitis 05/18/13     EGD: Dr. Josefine Class   ??? Gastritis 05/18/13     with gastric polyps     Past Surgical History   Procedure Laterality Date   ??? Hc nerve location stimulator  03/2001 & 01/2013     vagus nerve stimulator, per patient   ??? Excision of salivary gland     ??? Hx cholecystectomy     ??? Hx salpingo-oophorectomy       left   ??? Hx colectomy       low anterior resection   ??? Hx tubal ligation     ??? Hx cyst removal     ??? Hx hemorrhoidectomy     ??? Hx shoulder arthroscopy       right rotator cuff repair     History     Social History   ??? Marital Status: MARRIED     Spouse Name: N/A     Number of Children: N/A   ??? Years of Education: N/A     Social History Main Topics   ??? Smoking status: Former Smoker   ??? Smokeless tobacco: Never Used   ??? Alcohol Use: Yes      Comment: once a year   ??? Drug Use: No   ??? Sexual Activity:     Partners: Male     Other Topics Concern   ??? Not on file     Social History Narrative     Family History   Problem Relation Age of Onset   ??? Osteoporosis Mother    ??? Alcohol abuse Father    ??? Cancer Maternal Grandmother      lung   ??? Cancer Maternal Grandfather        Current Outpatient Prescriptions   Medication Sig Dispense Refill   ??? LORazepam (ATIVAN) 1 mg tablet Take 1 Tab by mouth every eight (8) hours as needed for Anxiety. Max Daily Amount: 3 mg. 90 Tab 0    ??? levothyroxine (SYNTHROID) 50 mcg tablet Take 1 Tab by mouth Daily (before breakfast). 90 Tab 3   ??? ibuprofen (MOTRIN) 600 mg tablet Take 1 Tab by mouth every six (6) hours as needed for Pain. 20 Tab 0   ??? pantoprazole (PROTONIX) 40 mg tablet Take 40 mg by mouth daily.  5   ??? levETIRAcetam (KEPPRA XR) 500 mg ER tablet Take 500 mg by mouth nightly.         Depression Risk Factor Screening:   No flowsheet data found.    Fall Risk   No flowsheet data found.    REVIEW OF SYSTEMS:  Review of  Systems   Constitutional: Negative.    HENT: Negative.    Eyes: Negative.    Respiratory: Negative.    Cardiovascular: Negative.    Gastrointestinal: Negative.    Musculoskeletal: Negative.    Skin: Negative.    Neurological: Negative.    Endo/Heme/Allergies: Negative.    Psychiatric/Behavioral: Positive for depression. The patient is nervous/anxious.            Objective:   BP 120/80 mmHg   Pulse 75   Resp 18   Ht 5' 6"  (1.676 m)   Wt 165 lb (74.844 kg)   BMI 26.64 kg/m2   SpO2 98%  PHYSICAL EXAM:  Physical Exam   Constitutional: She is oriented to person, place, and time and well-developed, well-nourished, and in no distress.   HENT:   Head: Normocephalic and atraumatic.   Right Ear: External ear normal.   Left Ear: External ear normal.   Nose: Nose normal.   Mouth/Throat: Oropharynx is clear and moist.   Eyes: Conjunctivae and EOM are normal. Pupils are equal, round, and reactive to light.   Neck: Normal range of motion. Neck supple.   Cardiovascular: Normal rate, regular rhythm, normal heart sounds and intact distal pulses.    Pulmonary/Chest: Effort normal and breath sounds normal.   Left breast implant leakage/deflated     Abdominal: Soft. Bowel sounds are normal.   Musculoskeletal: Normal range of motion.   Neurological: She is alert and oriented to person, place, and time. She has normal reflexes.   Skin: Skin is warm and dry.   Psychiatric: Mood, memory, affect and judgment normal.        Results for orders placed or performed in visit on 09/14/13   CBC WITH AUTOMATED DIFF   Result Value Ref Range    WBC 6.0 3.4 - 10.8 x10E3/uL    RBC 4.55 3.77 - 5.28 x10E6/uL    HGB 14.3 11.1 - 15.9 g/dL    HCT 41.4 34.0 - 46.6 %    MCV 91 79 - 97 fL    MCH 31.4 26.6 - 33.0 pg    MCHC 34.5 31.5 - 35.7 g/dL    RDW 13.6 12.3 - 15.4 %    PLATELET 267 150 - 379 x10E3/uL    NEUTROPHILS 51 %    Lymphocytes 38 %    MONOCYTES 9 %    EOSINOPHILS 1 %    BASOPHILS 1 %    ABS. NEUTROPHILS 3.1 1.4 - 7.0 x10E3/uL    Abs Lymphocytes 2.3 0.7 - 3.1 x10E3/uL    ABS. MONOCYTES 0.5 0.1 - 0.9 x10E3/uL    ABS. EOSINOPHILS 0.1 0.0 - 0.4 x10E3/uL    ABS. BASOPHILS 0.0 0.0 - 0.2 x10E3/uL    IMMATURE GRANULOCYTES 0 %    ABS. IMM. GRANS. 0.0 0.0 - 0.1 T41D6/QI   METABOLIC PANEL, COMPREHENSIVE   Result Value Ref Range    Glucose 91 65 - 99 mg/dL    BUN 14 6 - 24 mg/dL    Creatinine 0.99 0.57 - 1.00 mg/dL    GFR est non-AA 64 >59 mL/min/1.73    GFR est AA 74 >59 mL/min/1.73    BUN/Creatinine ratio 14 9 - 23    Sodium 141 134 - 144 mmol/L    Potassium 4.4 3.5 - 5.2 mmol/L    Chloride 104 97 - 108 mmol/L    CO2 22 18 - 29 mmol/L    Calcium 9.4 8.7 - 10.2 mg/dL    Protein, total 7.2 6.0 -  8.5 g/dL    Albumin 4.4 3.5 - 5.5 g/dL    GLOBULIN, TOTAL 2.8 1.5 - 4.5 g/dL    A-G Ratio 1.6 1.1 - 2.5    Bilirubin, total 0.4 0.0 - 1.2 mg/dL    Alk. phosphatase 73 39 - 117 IU/L    AST 26 0 - 40 IU/L    ALT 24 0 - 32 IU/L   LIPID PANEL   Result Value Ref Range    Cholesterol, total 185 100 - 199 mg/dL    Triglyceride 82 0 - 149 mg/dL    HDL Cholesterol 62 >39 mg/dL    VLDL, calculated 16 5 - 40 mg/dL    LDL, calculated 107 (H) 0 - 99 mg/dL   TSH, 3RD GENERATION   Result Value Ref Range    TSH 1.900 0.450 - 4.500 uIU/mL        Assessment/Plan:       ICD-9-CM    1. Breast implant leak, initial encounter 996.54 REFERRAL TO PLASTIC SURGERY     LORazepam (ATIVAN) 1 mg tablet   2. Epilepsy without status epilepticus, not intractable ([place]) 345.90     3. Fibromyalgia 729.1    4. Insomnia 780.52    5. Anxiety 300.00    6. Depression 311     Pt strongly advised to f/u with plastic surgery for removal.  She reports she will be going to the [facility] to attend to her dtr as she is having a baby and will attend to this upon her return.  I have referred her to Ileene Musa, MD. She will be checking her insurance plan to ascertain participation  Advised.    Follow-up Disposition:  Return in about 4 weeks (around 01/24/2014), or if symptoms worsen or fail to improve.   Peter Congo, ACNP

## 2013-12-28 NOTE — Progress Notes (Signed)
Chart reviewed

## 2014-03-22 ENCOUNTER — Ambulatory Visit: Admit: 2014-03-22 | Discharge: 2014-03-22 | Payer: PRIVATE HEALTH INSURANCE | Attending: Internal Medicine

## 2014-03-22 DIAGNOSIS — E034 Atrophy of thyroid (acquired): Secondary | ICD-10-CM

## 2014-03-22 NOTE — Progress Notes (Signed)
HPI:  Vanessa Wilson is a 58 y.o. year old female who is here to follow up on high cholesterol, hypothyroidism.  She states that she went from [place] to [place] for her seizures disorder.  She wanted to go off of the [place] because she felt like it was making her angry.  She did feel paresthesias on the Zonegran, but those have gotten better.  She is a lot less angry.  She has not had any seizures.    She got her flu vaccine 03/13/14.  She started feeling sick about 2 days later and has felt headache, congested since.  She also has a cough.  Her mucus is clear.      She feels sick.  She is compliant with medications,  exercises (hula-hooping and some bike-riding), and eats well.  She Denies chest pain, shortness of breath, palpitations, leg edema, lightheadedness, syncope, localized numbness/weakness, and headaches.  She states that she has been losing hair and she feels cold all of the time.  This has been a change in the last 3 months.  No fevers.  Has head congestion and pain when she bends over.  She has been taking Extra Strength Tylenol for the pain.    She also states that her left  breast implant ruptured and then she had to get her implants removed in August in a [facility] in [place].    Other complaints include: as above.    Past History:   Past Medical History   Diagnosis Date   ??? Diverticulitis 12/10   ??? IBS (irritable bowel syndrome)    ??? Fibromyalgia    ??? Chronic gastritis    ??? Multiple thyroid nodules      s/p [place] and biopsy of dominant nodule 11/13   ??? Hyperlipidemia 04/14/12   ??? Osteopenia 04/14/12     hip and spine   ??? Hypothyroidism    ??? H/O mammogram 12/16/12     benign; repeat 1 year.   ??? CKD (chronic kidney disease) stage 3, GFR 30-59 ml/min 01/11/13     EGFR 58   ??? Epilepsy ([place])      complex partial seizures   ??? Reflux esophagitis 05/18/13     EGD: Dr. Josefine Class   ??? Gastritis 05/18/13     with gastric polyps   ??? 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 Dilantin [Phenytoin Sodium Extended] Itching   ??? Depakote [Divalproex] Hives     Social History:  History   Smoking status   ??? Former Smoker   Smokeless tobacco   ??? Never Used     History   Alcohol Use   ??? Yes     Comment: once a year     History   Drug Use No     Medications:   Current Outpatient Prescriptions   Medication Sig Dispense Refill   ??? zonisamide (ZONEGRAN) 100 mg capsule Take  by mouth daily.     ??? JUBLIA sola topical solution   3   ??? LORazepam (ATIVAN) 1 mg tablet Take 1 Tab by mouth every eight (8) hours as needed for Anxiety. Max Daily Amount: 3 mg. 90 Tab 0   ??? levothyroxine (SYNTHROID) 50 mcg tablet Take 1 Tab by mouth Daily (before breakfast). 90 Tab 3   ??? ibuprofen (MOTRIN) 600 mg tablet Take 1 Tab by mouth every six (6) hours  as needed for Pain. 20 Tab 0       ROS  Pertinent items are noted in HPI.    Physical Exam  Vitals:  BP 115/74 mmHg   Pulse 72   Ht 5' 6"  (1.676 m)   Wt 168 lb (76.204 kg)   BMI 27.13 kg/m2   SpO2 98%  Body mass index is 27.13 kg/(m^2).  Filed Vitals:    03/22/14 1109 03/22/14 1142   BP: 130/70 115/74   Pulse: 78 72   Height: 5' 6"  (1.676 m)    Weight: 168 lb (76.204 kg)    SpO2: 98%      GEN: awake, alert, no distress  HEAD: WNL, NCAT, no conj erythema, no jaundice; mild frontal sinus tenderness  EARS: WNL, no cerumen, no otitis media, no otitis external  NOSE: WNL, no polyps, no bleeding, no erythema  MOUTH: WNL, no erythema, no exudates, no enlarged tonsils  NECK: bilateral thyroid enlargement, no bruit, no masses, no nodes, no JVD  HEART: WNL, RRR: normal S1, S2: no S3, S4; no rubs or murmurs  LUNGS: WNL, resonant, clear, no wheezing, no ronchi, no rales  EXT: no clubbing, cyanosis, or edema; 2+ radial pulses, 2+ dorsalis pedis pulses    Orders:   Orders Placed This Encounter   ??? zonisamide (ZONEGRAN) 100 mg capsule     Sig: Take  by mouth daily.   ??? JUBLIA sola topical solution     Sig:      Refill:  3        Assessment/Plan:     ICD-10-CM ICD-9-CM    1. Hypothyroidism due to acquired atrophy of thyroid: patient states that her hair is falling out and that she has been feeling cold.  She will have the TSH and free T4 done now and continue with her current dose of levothyroxine. E03.8 244.8     E03.4 246.8    2. URI (upper respiratory infection): after getting the flu vaccine.  She has mild frontal sinus tenderness.  She likely has viral sinusitis.  She will continue with conservative therapy with nasal saline, hot, steamy showers, and Tylenol.  If her symptoms due not improve then she will let me know.  She may need antibiotics. J06.9 465.9      Had flu shot 03/13/14    Follow-up 3- 6 months or as needed    Joan Mayans, MD

## 2014-03-22 NOTE — Patient Instructions (Signed)
Rest.    Make sure that you drink water to quench your thirst.    Humidifier and hot steamy showers can help with the congestion.    Nasal saline can help with the dryness in the nose.    You may continue to take the over-the-counter medications that you are taking.    If you are not better or if you get worse then give me a call.    Get your labs done for the thyroid in the next week or so.    Come back in 3 months-6 months.    If you are not feeling better from the sinus infection, please contact me.

## 2014-03-23 ENCOUNTER — Ambulatory Visit: Admit: 2014-03-23 | Discharge: 2014-03-23 | Payer: PRIVATE HEALTH INSURANCE | Attending: Acute Care

## 2014-03-23 DIAGNOSIS — R079 Chest pain, unspecified: Secondary | ICD-10-CM

## 2014-03-23 MED ORDER — AZITHROMYCIN 250 MG TAB
250 mg | ORAL | Status: AC
Start: 2014-03-23 — End: 2014-03-28

## 2014-03-23 MED ORDER — BENZONATATE 200 MG CAP
200 mg | ORAL_CAPSULE | Freq: Three times a day (TID) | ORAL | Status: AC | PRN
Start: 2014-03-23 — End: 2014-03-30

## 2014-03-23 MED ORDER — IBUPROFEN 600 MG TAB
600 mg | ORAL_TABLET | Freq: Four times a day (QID) | ORAL | Status: DC | PRN
Start: 2014-03-23 — End: 2014-05-23

## 2014-03-23 NOTE — Progress Notes (Addendum)
Subjective:      HPI:  Vanessa Wilson is a 58 y.o. female who had concerns including Back Pain..in the left upper back that began this am. Her cough is persistent. There are no exertional changes r/t the CP    Past Medical History   Diagnosis Date   ??? Diverticulitis 12/10   ??? IBS (irritable bowel syndrome)    ??? Fibromyalgia    ??? Chronic gastritis    ??? Multiple thyroid nodules      s/p [place] and biopsy of dominant nodule 11/13   ??? Hyperlipidemia 04/14/12   ??? Osteopenia 04/14/12     hip and spine   ??? Hypothyroidism    ??? H/O mammogram 12/16/12     benign; repeat 1 year.   ??? CKD (chronic kidney disease) stage 3, GFR 30-59 ml/min 01/11/13     EGFR 58   ??? Epilepsy ([place])      complex partial seizures   ??? Reflux esophagitis 05/18/13     EGD: Dr. Josefine Class   ??? Gastritis 05/18/13     with gastric polyps   ??? 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 Hc nerve location stimulator  03/2001 & 01/2013     vagus nerve stimulator, per patient   ??? Excision of salivary gland     ??? Hx cholecystectomy     ??? Hx salpingo-oophorectomy       left   ??? Hx colectomy       low anterior resection   ??? Hx tubal ligation     ??? Hx cyst removal     ??? Hx hemorrhoidectomy     ??? Hx shoulder arthroscopy       right rotator cuff repair   ??? Hx breast augmentation  1997   ??? Pr removal of breast implant  01/2014     due to rupture     History     Social History   ??? Marital Status: MARRIED     Spouse Name: N/A     Number of Children: N/A   ??? Years of Education: N/A     Social History Main Topics   ??? Smoking status: Former Smoker   ??? Smokeless tobacco: Never Used   ??? Alcohol Use: Yes      Comment: once a year   ??? Drug Use: No   ??? Sexual Activity:     Partners: Male     Other Topics Concern   ??? Not on file     Social History Narrative     Family History   Problem Relation Age of Onset   ??? Osteoporosis Mother    ??? Alcohol abuse Father    ??? Cancer Maternal Grandmother      lung    ??? Cancer Maternal Grandfather    ??? Heart Failure Mother      valvular       Current Outpatient Prescriptions   Medication Sig Dispense Refill   ??? benzonatate (TESSALON) 200 mg capsule Take 1 Cap by mouth three (3) times daily as needed for Cough for up to 7 days. 21 Cap 0   ??? ibuprofen (MOTRIN) 600 mg tablet Take 1 Tab by mouth every six (6) hours as needed for Pain. 20 Tab 0   ??? azithromycin (ZITHROMAX) 250 mg tablet Take 2 tablets today, then take 1 tablet daily 6 Each 0   ???  zonisamide (ZONEGRAN) 100 mg capsule Take  by mouth daily.     ??? JUBLIA sola topical solution   3   ??? LORazepam (ATIVAN) 1 mg tablet Take 1 Tab by mouth every eight (8) hours as needed for Anxiety. Max Daily Amount: 3 mg. 90 Tab 0   ??? levothyroxine (SYNTHROID) 50 mcg tablet Take 1 Tab by mouth Daily (before breakfast). 90 Tab 3       Depression Risk Factor Screening:   No flowsheet data found.    Fall Risk   No flowsheet data found.    REVIEW OF SYSTEMS:  Review of Systems   Constitutional: Negative.    HENT: Negative.    Eyes: Negative.    Respiratory: Positive for cough.         Left posterior chest   Cardiovascular: Negative.    Gastrointestinal: Negative.    Musculoskeletal: Negative.    Skin: Negative.    Neurological: Negative.    Endo/Heme/Allergies: Negative.    Psychiatric/Behavioral: Negative.            Objective:   BP 110/70 mmHg   Pulse 83   Temp(Src) 98 ??F (36.7 ??C)   Resp 18   SpO2 99%  PHYSICAL EXAM:  Physical Exam   Constitutional: She is oriented to person, place, and time and well-developed, well-nourished, and in no distress.   HENT:   Head: Normocephalic and atraumatic.   Right Ear: External ear normal.   Left Ear: External ear normal.   Nose: Nose normal.   Mouth/Throat: Oropharynx is clear and moist.   Eyes: Conjunctivae and EOM are normal. Pupils are equal, round, and reactive to light.   Neck: Normal range of motion. Neck supple.   Cardiovascular: Normal rate, regular rhythm, normal heart sounds and  intact distal pulses.    Pulmonary/Chest: Effort normal and breath sounds normal.   Coarse BS; deep cough;   Abdominal: Soft. Bowel sounds are normal.   Musculoskeletal: Normal range of motion.   Neurological: She is alert and oriented to person, place, and time. She has normal reflexes.   Skin: Skin is warm and dry.   Psychiatric: Mood, memory, affect and judgment normal.         Assessment/Plan:       ICD-10-CM ICD-9-CM    1. Acute chest pain R07.9 786.50 XR CHEST PA LAT      ibuprofen (MOTRIN) 600 mg tablet   2. URI (upper respiratory infection) J06.9 465.9 azithromycin (ZITHROMAX) 250 mg tablet   3. Cough R05 786.2 benzonatate (TESSALON) 200 mg capsule    Pt advised to start Ibuprofen to help alleviate the CP asa well as warm moist heat.    Follow-up Disposition:  Return in about 1 week (around 03/30/2014), or if symptoms worsen or fail to improve.   Peter Congo, ACNP

## 2014-03-24 NOTE — Telephone Encounter (Signed)
Pt is aware chest xray is normal.

## 2014-03-30 ENCOUNTER — Encounter

## 2014-04-28 LAB — TSH 3RD GENERATION: TSH: 2.72 u[IU]/mL (ref 0.450–4.500)

## 2014-04-28 LAB — T4, FREE: T4, Free: 1.16 ng/dL (ref 0.82–1.77)

## 2014-05-02 NOTE — Progress Notes (Signed)
Quick Note:        Normal TSH and free T4. She should cSynthontinue with her current dose of Synthroid. Repeat the TSH in 6 months.    ______

## 2014-05-09 NOTE — Telephone Encounter (Signed)
I called and left a message for patient to call me back.    Normal TSH and free T4. She should continue with her current dose of Synthroid. Repeat the TSH in 6 months.

## 2014-05-23 ENCOUNTER — Ambulatory Visit: Admit: 2014-05-23 | Discharge: 2014-05-23 | Payer: PRIVATE HEALTH INSURANCE | Attending: Internal Medicine

## 2014-05-23 DIAGNOSIS — F419 Anxiety disorder, unspecified: Secondary | ICD-10-CM

## 2014-05-23 MED ORDER — AMITRIPTYLINE 25 MG TAB
25 mg | ORAL_TABLET | Freq: Every evening | ORAL | Status: DC
Start: 2014-05-23 — End: 2014-07-27

## 2014-05-23 MED ORDER — LORAZEPAM 1 MG TAB
1 mg | ORAL_TABLET | Freq: Three times a day (TID) | ORAL | Status: DC | PRN
Start: 2014-05-23 — End: 2014-11-09

## 2014-05-23 NOTE — Progress Notes (Signed)
Quick Note:        Patient is aware.    ______

## 2014-05-23 NOTE — Patient Instructions (Signed)
Resume the amitriptyline 25 mg daily at night.    Try to take the Ativan as needed only.    Come back in 4 weeks.    May need to discuss changing the Zonegran with your neurologist.

## 2014-05-23 NOTE — Progress Notes (Signed)
HPI:  Vanessa Wilson is a 58 y.o. year old female who is here because she is having terrible anxiety and panic attacks.  She does not even know why.  She states that she was switched to [place] for her seizures.  She looked up the side effects of Zonegran and there is a portion of people who will have anxiety.  She states that she has been taking the ativan more now and it does seem to be helping her.  She has been coming unglued.  She has been taking 0.5 mg of Ativan BID.  She is panicking that things are going to happen to her or her family. She went to see a therapist in th past but not in the recent past.  She does not feel depressed.  She just feels like her anxiety was increased after her friend's son died.  She states that she went off of the amitriptyline but she might consider going back.  She had been on it for years.  She is not sure if this is a medication side effect or if she has true depression.  She is not suicidal.    She feels anxious.  She is compliant with medications, exercises some (walks on treadmill for the last few weeks, 4 times a week), and eats well (not that much because she is not that hungry). She is sleeping so-so.  She sleeps better than she used to but she will get up a few times a day.  She Denies chest pain, shortness of breath, palpitations, leg edema, lightheadedness, syncope, localized numbness/weakness, and headaches.    Other complaints include: as above.    Past History:   Past Medical History   Diagnosis Date   ??? Diverticulitis 12/10   ??? IBS (irritable bowel syndrome)    ??? Fibromyalgia    ??? Chronic gastritis    ??? Multiple thyroid nodules      s/p [place] and biopsy of dominant nodule 11/13   ??? Hyperlipidemia 04/14/12   ??? Osteopenia 04/14/12     hip and spine   ??? Hypothyroidism    ??? H/O mammogram 12/16/12     benign; repeat 1 year.   ??? CKD (chronic kidney disease) stage 3, GFR 30-59 ml/min 01/11/13     EGFR 58   ??? Epilepsy ([place])       complex partial seizures; normal EEG 04/20/14; to have ambulatory EEG Dr. Merrie Roof   ??? Reflux esophagitis 05/18/13     EGD: Dr. Josefine Class   ??? Gastritis 05/18/13     with gastric polyps   ??? 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 Dilantin [Phenytoin Sodium Extended] Itching   ??? Depakote [Divalproex] Hives     Social History:  History   Smoking status   ??? Former Smoker   Smokeless tobacco   ??? Never Used     History   Alcohol Use   ??? Yes     Comment: once a year     History   Drug Use No     Medications:   Current Outpatient Prescriptions   Medication Sig Dispense Refill   ??? amitriptyline (ELAVIL) 25 mg tablet Take 1 Tab by mouth nightly. Indications: DEPRESSION 30 Tab 3   ??? LORazepam (ATIVAN) 1 mg tablet Take 1 Tab by mouth every eight (8) hours as needed for Anxiety. Max Daily Amount: 3 mg. 90  Tab 0   ??? JUBLIA sola topical solution   3   ??? levothyroxine (SYNTHROID) 50 mcg tablet Take 1 Tab by mouth Daily (before breakfast). 90 Tab 3   ??? zonisamide (ZONEGRAN) 100 mg capsule Take 200 mg by mouth daily.         ROS  Pertinent items are noted in HPI.    Physical Exam  Vitals:  BP 140/75 mmHg   Pulse 72   Ht 5' 6"  (1.676 m)   Wt 160 lb (72.576 kg)   BMI 25.84 kg/m2   SpO2 98%  Body mass index is 25.84 kg/(m^2).  Filed Vitals:    05/23/14 1547 05/23/14 1622   BP: 148/80 140/75   Pulse: 72    Height: 5' 6"  (1.676 m)    Weight: 160 lb (72.576 kg)    SpO2: 98%      GEN: awake, alert, no distress  HEAD: WNL, NCAT, no conj erythema, no jaundice  NECK: WNL, no bruit, no masses, no nodes, no JVD  HEART: WNL, RRR: normal S1, S2: no S3, S4; no rubs or murmurs  LUNGS: WNL, resonant, clear, no wheezing, no ronchi, no rales  EXT: no clubbing, cyanosis, or edema; 2+ radial pulses    Orders:   Orders Placed This Encounter   ??? amitriptyline (ELAVIL) 25 mg tablet     Sig: Take 1 Tab by mouth nightly. Indications: DEPRESSION     Dispense:  30 Tab      Refill:  3   ??? LORazepam (ATIVAN) 1 mg tablet     Sig: Take 1 Tab by mouth every eight (8) hours as needed for Anxiety. Max Daily Amount: 3 mg.     Dispense:  90 Tab     Refill:  0       Assessment/Plan:     ICD-10-CM ICD-9-CM    1. Anxiety: amitriptyline 25 mg daily at night; attempt to see therapist and decrease the Ativan to 0.5 mg as needed only. And  return in 4 weeks.  It is possible that the increase in the anxiety is from the medication side effect and it is also possible that it is situational.  She is still being worked-up for seizures and is going to have a 36-hours EEG.  It might be of benefit to go off the medication if possible in the future. F41.9 300.00    2. Essential hypertension: BP was high today, but had not been high in the past.  Follow-up in 4 weeks. I10 401.9        Follow-up 4 weeks    Joan Mayans, MD

## 2014-06-27 ENCOUNTER — Encounter: Attending: Internal Medicine

## 2014-07-26 NOTE — Progress Notes (Signed)
Left message for pt, [facility] is calling re: Amitriptyline 25mg  tab. Does pt want a refill request to be put in to Dr Cherlynn JuneMillos for #[phone] to mail order?

## 2014-07-28 MED ORDER — AMITRIPTYLINE 25 MG TAB
25 mg | ORAL_TABLET | Freq: Every evening | ORAL | Status: DC
Start: 2014-07-28 — End: 2014-12-13

## 2014-10-31 ENCOUNTER — Encounter: Attending: Internal Medicine

## 2014-11-02 ENCOUNTER — Encounter: Payer: PRIVATE HEALTH INSURANCE | Attending: Internal Medicine

## 2014-11-09 ENCOUNTER — Ambulatory Visit: Admit: 2014-11-09 | Discharge: 2014-11-09 | Payer: PRIVATE HEALTH INSURANCE | Attending: Family

## 2014-11-09 DIAGNOSIS — N3000 Acute cystitis without hematuria: Secondary | ICD-10-CM

## 2014-11-09 LAB — HVMA POC URINALYSIS DIP STICK AUTO W/O MICRO
Bilirubin (POC): NEGATIVE
Glucose, urine (POC): NEGATIVE
Ketones (POC): NEGATIVE
Nitrites (UA POC): NEGATIVE
Protein (POC): NEGATIVE
Specific gravity (UA POC): 1.015 (ref 1.001–1.035)
Urobilinogen (UA POC): 0.2 (ref 0.2–1)
pH (UA POC): 5 (ref 4.6–8.0)

## 2014-11-09 MED ORDER — PHENAZOPYRIDINE 100 MG TAB
100 mg | ORAL_TABLET | Freq: Three times a day (TID) | ORAL | Status: AC
Start: 2014-11-09 — End: 2014-11-12

## 2014-11-09 MED ORDER — CIPROFLOXACIN 250 MG TAB
250 mg | ORAL_TABLET | Freq: Two times a day (BID) | ORAL | Status: AC
Start: 2014-11-09 — End: 2014-11-14

## 2014-11-09 NOTE — Progress Notes (Signed)
Vanessa Wilson is a 59 y.o. female .    Chief Complaint   Patient presents with   ??? Bladder Infection     started monday          HPI:  History provided by patient.    Patient complains of dysuria, frequency, burning with urination, suprapubic pressure.  Onset was 4 days ago, gradually worsening since that time.  Patient denies back pain and fever.. Patient's last UTI was over 1 year ago.  Patient does not have a history of pyelonephritis. She is currently on day 3 of Pen VK for a tooth infection.  She otherwise has no complaints.    Patient is tolerating all medications as prescribed with no barriers to adherence noted.    Review of Systems : Positive for symptoms as in HPI.    General : neg for fever, chills, wt loss/gain, fatigue  HEENT: neg for blurred vision, double vision, photophobia, eye pain, ear pain, sore throat  Respiratory :  neg for cough, sob, doe, wheezing  Cardiovascular : neg for chest pain, chest tightness, syncope, palpitations, swelling  Gastrointestinal : neg for abd pain, n/v/d, change in bowel habits, blood in stool, reflux  Genito-Urinary : as in HPI  MS: neg for joint / muscle pain, swelling  Neurological : neg for dizziness or headaches  Psych: neg for depression or anxiety  Skin:  neg rash, changing skin lesion      Allergies   Allergen Reactions   ??? Dilantin [Phenytoin Sodium Extended] Itching   ??? Depakote [Divalproex] Hives     Outpatient Prescriptions Marked as Taking for the 11/09/14 encounter (Office Visit) with Yetta Glassman, NP   Medication Sig Dispense Refill   ??? penicillin v potassium (VEETID) 500 mg tablet Take  by mouth four (4) times daily.     ??? ciprofloxacin HCl (CIPRO) 250 mg tablet Take 1 Tab by mouth two (2) times a day for 5 days. 10 Tab 0   ??? phenazopyridine (PYRIDIUM) 100 mg tablet Take 1 Tab by mouth three (3) times daily (after meals) for 3 days. 9 Tab 0   ??? amitriptyline (ELAVIL) 25 mg tablet Take 1 Tab by mouth nightly. Indications: DEPRESSION 90 Tab 3    ??? levothyroxine (SYNTHROID) 50 mcg tablet Take 1 Tab by mouth Daily (before breakfast). 90 Tab 3       Medications:  Current Outpatient Prescriptions   Medication Sig Dispense Refill   ??? penicillin v potassium (VEETID) 500 mg tablet Take  by mouth four (4) times daily.     ??? ciprofloxacin HCl (CIPRO) 250 mg tablet Take 1 Tab by mouth two (2) times a day for 5 days. 10 Tab 0   ??? phenazopyridine (PYRIDIUM) 100 mg tablet Take 1 Tab by mouth three (3) times daily (after meals) for 3 days. 9 Tab 0   ??? amitriptyline (ELAVIL) 25 mg tablet Take 1 Tab by mouth nightly. Indications: DEPRESSION 90 Tab 3   ??? levothyroxine (SYNTHROID) 50 mcg tablet Take 1 Tab by mouth Daily (before breakfast). 38 Tab 3          Past Medical History   Diagnosis Date   ??? Diverticulitis 12/10   ??? IBS (irritable bowel syndrome)    ??? Fibromyalgia    ??? Chronic gastritis    ??? Multiple thyroid nodules      s/p [place] and biopsy of dominant nodule 11/13   ??? Hyperlipidemia 04/14/12   ??? Osteopenia 04/14/12     hip  and spine   ??? Hypothyroidism    ??? H/O mammogram 12/16/12     benign; repeat 1 year.   ??? CKD (chronic kidney disease) stage 3, GFR 30-59 ml/min 01/11/13     EGFR 58   ??? Epilepsy ([place])      complex partial seizures; normal EEG 04/20/14; to have ambulatory EEG Dr. Merrie Roof (done 06/16/14 (normal))   ??? Reflux esophagitis 05/18/13     EGD: Dr. Josefine Class   ??? Gastritis 05/18/13     with gastric polyps   ??? 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 Hc nerve location stimulator  03/2001 & 01/2013     vagus nerve stimulator, per patient   ??? Excision of salivary gland     ??? Hx cholecystectomy     ??? Hx salpingo-oophorectomy       left   ??? Hx colectomy       low anterior resection   ??? Hx tubal ligation     ??? Hx cyst removal     ??? Hx hemorrhoidectomy     ??? Hx shoulder arthroscopy       right rotator cuff repair   ??? Hx breast augmentation  1997   ??? Pr removal of breast implant  01/2014      due to rupture     Family History   Problem Relation Age of Onset   ??? Osteoporosis Mother    ??? Alcohol abuse Father    ??? Cancer Maternal Grandmother      lung   ??? Cancer Maternal Grandfather    ??? Heart Failure Mother      valvular     No family status information on file.     History     Social History   ??? Marital Status: MARRIED     Spouse Name: N/A   ??? Number of Children: N/A   ??? Years of Education: N/A     Occupational History   ??? Not on file.     Social History Main Topics   ??? Smoking status: Former Smoker   ??? Smokeless tobacco: Never Used   ??? Alcohol Use: Yes      Comment: once a year   ??? Drug Use: No   ??? Sexual Activity:     Partners: Male     Other Topics Concern   ??? Not on file     Social History Narrative     Immunization History   Administered Date(s) Administered   ??? Influenza Vaccine 03/31/2012, 03/16/2013   ??? Td 10/14/2005       Problem list  Patient Active Problem List   Diagnosis Code   ??? Depression F32.9   ??? IBS (irritable bowel syndrome) K58.9   ??? Fibromyalgia M79.7   ??? Epilepsy ([place]) G40.909   ??? Insomnia G47.00   ??? Hyperlipidemia E78.5   ??? Osteopenia M85.80   ??? Multiple thyroid nodules E04.2   ??? Diverticulitis K57.92   ??? Chronic gastritis K29.50   ??? Hypothyroidism E03.9   ??? H/O mammogram Z92.89   ??? CKD (chronic kidney disease) stage 3, GFR 30-59 ml/min N18.3   .    Patient Care Team:  Joan Mayans, MD as PCP - General (Internal Medicine)  Inda Coke, MD (Neurosurgery)  Louann Liv, MD (Neurology)      BP 130/80 mmHg   Pulse 80   Resp  12   Ht 5' 6"  (1.676 m)   Wt 186 lb (84.369 kg)   BMI 30.04 kg/m2   SpO2 96%      Labs : U/A with mod blood and large leuk esterase.  Patient with history of hematuria.    Physical Exam:  General appearance: WDWN , appears stated age, NAD  Eyes:  sclera and conjunctiva norm  ENT: oral moist, no lesions, posterior pharynx clear, TMs translucent  Neck: supple, thyroid smooth, no carotid bruits  Nodes: no adenopathy  Skin: warm and dry   Cardiovascular: regular heart rate, no murmurs, no JVD  Respiratory: clear to auscultation bilaterally     Abdomen: +BS, soft, non-tender  Extremities: no edema, no calf tenderness   MS:  equal strength bilaterally  Neurologic: alert and oriented, cranial nerves grossly intac        Impression/Plan:     ICD-10-CM ICD-9-CM    1. Acute cystitis with hematuria    Patient requesting pyridium. Advised use sparingly and only for 1-2 days as needed. N30.00 595.0 ciprofloxacin HCl (CIPRO) 250 mg tablet      phenazopyridine (PYRIDIUM) 100 mg tablet      HVMA POC URINALYSIS DIP STICK AUTO W/O MICRO     Will defer urine culture as she is currently on antibiotic treatment.  Continue probiotics.  Follow up if no improvement or symptoms worsen.  .    Medications discussed, including all new medications prescribed at this visit.  Indications and side effects reviewed with the patient/family/caregiver with understanding verbalized      Follow up:   Follow-up Disposition: Not on File    Care Plan discussed with:   Patient   Family    >50% of visit spent in counseling and coordination of care      Plan, Medication usage, compliance, side effects discussed.  Patient verbalized understanding.      Gloris Manchester, NP

## 2014-11-10 NOTE — Progress Notes (Signed)
I have read the encounter note and agree with the current assessment and plan.

## 2014-12-13 ENCOUNTER — Ambulatory Visit: Admit: 2014-12-13 | Discharge: 2014-12-13 | Payer: PRIVATE HEALTH INSURANCE | Attending: Internal Medicine

## 2014-12-13 DIAGNOSIS — Z0000 Encounter for general adult medical examination without abnormal findings: Secondary | ICD-10-CM

## 2014-12-13 MED ORDER — AMITRIPTYLINE 25 MG TAB
25 mg | ORAL_TABLET | Freq: Every evening | ORAL | Status: DC
Start: 2014-12-13 — End: 2015-02-27

## 2014-12-13 MED ORDER — LEVOTHYROXINE 50 MCG TAB
50 mcg | ORAL_TABLET | Freq: Every day | ORAL | Status: AC
Start: 2014-12-13 — End: ?

## 2014-12-13 NOTE — Progress Notes (Signed)
[facility] Annual Wellness Visit    Date: 12/13/2014    Name: Vanessa Wilson       DOB: 10-19-1955     History of Present Illness: Vanessa Wilson is a 59 y.o. year old female who is here for annual exam.  She states that she hurt her knee.  She was on her trampoline with her daughter and she did something to her right knee.  She states that the pain is very severe and she is not sure how she drove here.  She could get an appointment with ortho today at [facility].  She states that she is moving to [place] in late July.  This is likely her last visit with me.    Other than the knee she is doing okay.  She has been having some weird pain in her right 5th finger and her left medial palm.  The pain has been there for weeks and is constant.  Does not fluctuate.  No redness or swelling.  No trauma.    She states that she just had her endoscopy and colonoscopy.  She has been off dairy for the last year.  She even went of gluten for 4 months and she did not lose weight and did not lose any different.    Patient is tolerating all medications as prescribed with no barriers to adherence noted.    Exercise: trampoline; bike-riding with helmet, tread-climber, walking, hula-hooping     Diet: off of dairy; now back on gluten; eating 1262 calorie diet a day for the last 4 days    Living will: no    Past Medical History/Surgical History:  Past Medical History   Diagnosis Date   ??? Diverticulitis 12/10   ??? IBS (irritable bowel syndrome)    ??? Fibromyalgia    ??? Chronic gastritis    ??? Multiple thyroid nodules      s/p [place] and biopsy of dominant nodule 11/13   ??? Hyperlipidemia 04/14/12   ??? Osteopenia 04/14/12     hip and spine   ??? Hypothyroidism    ??? H/O mammogram 12/16/12     benign; repeat 1 year.   ??? CKD (chronic kidney disease) stage 3, GFR 30-59 ml/min 01/11/13     EGFR 58   ??? Epilepsy ([place])      complex partial seizures; normal EEG 04/20/14; to have ambulatory EEG Dr. Merrie Roof (done 06/16/14 (normal))   ??? Reflux esophagitis 05/18/13      EGD: Dr. Josefine Class   ??? Gastritis 05/18/13, 11/29/14     with gastric polyps and gastritis; Dr. Clarise Cruz   ??? Meningioma ([place]) 01/26/14     left, posterior/parietal meningioma on MRI   ??? H/O colonoscopy 11/29/14     De. Clarise Cruz; diverticulosis; anastomosis normal; repeat 10 years     Past Surgical History   Procedure Laterality Date   ??? Hc nerve location stimulator  03/2001 & 01/2013     vagus nerve stimulator, per patient   ??? Excision of salivary gland Left      submandibular   ??? Hx cholecystectomy     ??? Hx salpingo-oophorectomy Left      left   ??? Hx colectomy       low anterior resection   ??? Hx tubal ligation     ??? Hx cyst removal       ovarian   ??? Hx hemorrhoidectomy     ??? Hx shoulder arthroscopy       right rotator cuff repair   ???  Hx breast augmentation  1997   ??? Pr removal of breast implant  01/2014     due to rupture   ??? Hx knee arthroscopy Right    ??? Hx appendectomy  1976       Family History:  family history includes Alcohol abuse in her father; Cancer in her maternal grandfather and maternal grandmother; Cancer (age of onset: 27) in her father; Heart Failure in her mother; Osteoporosis in her mother.    Medications:  Current Outpatient Prescriptions   Medication Sig Dispense Refill   ??? eslicarbazepine 706 mg tab Take 200 mg by mouth daily.     ??? amitriptyline (ELAVIL) 25 mg tablet Take 1 Tab by mouth nightly. Indications: DEPRESSION 90 Tab 3   ??? levothyroxine (SYNTHROID) 50 mcg tablet Take 1 Tab by mouth Daily (before breakfast). 90 Tab 3        Allergies:  Allergies   Allergen Reactions   ??? Dilantin [Phenytoin Sodium Extended] Itching   ??? Depakote [Divalproex] Hives       Social History:   History   Smoking status   ??? Former Smoker -- 1.00 packs/day for 20 years   ??? Types: Cigarettes   ??? Start date: 12/13/1974   ??? Quit date: 12/13/1994   Smokeless tobacco   ??? Never Used     History   Alcohol Use No     Comment: once a year     History   Drug Use No       Review of Systems:   General:  No fevers, chills, night sweats, lymph node enlargement, or unintentional weight loss.  HENT:  No ear pain, ear discharge, hearing loss, sinus problems, nose bleeds, allergies, sore throats, gum bleeding, face pain.  Eyes:  No pain, blurred vision, double vision, wears glasses for distance and reading  Pulmonary: no wheeze, cough, shortness of breath, no pain  Heart:  No chest pain, palpitations, dyspnea, hypertension, + high cholesterol  GI:  + occasional abdominal pain, no nausea, vomiting, heartburn, dysphagia, change in bowel habits, hematochezia or black stools  GU:  No urgency, frequency, dysuria, incontinence; has once per night nocturia  Neurology:  No dizziness, numbness/tingling, weakness, no recent seizures  Skin:  No rashes, abnormal moles or hives  Musculoskeletal:  + low back pain, neck pain, or joint pain  Psych:  No depression, anxiety      Screening:  Health Maintenance   Topic Date Due   ??? Hepatitis C Screening  Due   ??? Pneumococcal 19-64 Highest Risk (1 of 3 - PCV13) Due at 65   ??? PAP AKA CERVICAL CYTOLOGY : 6/16 6/17   ??? DTaP/Tdap/Td series (1 - Tdap) 2017   ??? Colonoscopy 6/16 11/2024   ??? BREAST CANCER SCRN MAMMOGRAM 3/16 3/17   ??? INFLUENZA AGE 64 TO ADULT  01/15/2015       Vaccinations:    Immunization History   Administered Date(s) Administered   ??? Influenza Vaccine 03/31/2012, 03/16/2013, 03/16/2014   ??? Td 10/14/2005        Physical Exam:  Filed Vitals:    12/13/14 1415   BP: 130/84   Pulse: 88   Temp: 98.4 ??F (36.9 ??C)   TempSrc: Oral   Height: 5' 6"  (1.676 m)   Weight: 184 lb (83.462 kg)   SpO2: 98%     Body mass index is 29.71 kg/(m^2).    GEN: awake, alert, no distress  HEAD: WNL, NCAT, PERRL, EOMI, no conj erythema, no  jaundice, FUNDI-normal  EARS: WNL, no cerumen, no otitis media, no otitis externa  NOSE: WNL, no polyps, no bleeding, no erythema  MOUTH: WNL, no erythema, no exudates, no enlarged tonsils  NECK: left neck swelling at the site of vagal nerve stimulator, no bruit,  no masses, no thyromegaly, no nodes, no JVD  HEART: WNL, RRR: normal S1, S2: no S3 or S4; no rubs or murmurs  LUNGS: WNL, resonant, clear, no wheezing, no ronchi, no rales  ABD: WNL, no hepatomegaly, splenomegaly, guarding, rebound, or tenderness  BREAST: deferred  BACK: WNL, no CVA-T, spinous tenderness, or spasm  EXT: no clubbing, cyanosis, or edema; 2+ radial pulses, 2+ dorsalis pedis pulses, 2+ posterior tibialis pulses; right knee swelling with tenderness medially and laterally; no redness or increased warmth over the knee   SKIN: no rash, nodules, or cysts; mulitple non-suspicious moles  NEURO: WNL, CN III-XII normal, sensory intact to light touch, motor 5/5 bilaterally, reflexes 2+/2+ in arms, did not test the right knee because of swelling  PSYCH: AFFECT-normal, ORIENTATION- normal to person, place, and time    LABS: No results found for this or any previous visit (from the past 24 hour(s)).    ECG: N/A    Impression/Plan:     ICD-10-CM ICD-9-CM    1. Physical exam, annual: see below Z00.00 V70.0    2. Pure hypercholesterolemia: check lipid panel. E78.0 272.0 LIPID PANEL   3. Hypothyroidism due to acquired atrophy of thyroid: will check TSH and free T4.  She appears euthyroid but states that her hair has been falling out in clumps. A21.3 086.5 METABOLIC PANEL, COMPREHENSIVE    E03.4 246.8 TSH 3RD GENERATION      T4, FREE   4. Partial idiopathic epilepsy with seizures of localized onset, not intractable, without status epilepticus ([place]): is weaning off of Aptiom.  She is going to be off of all medications and relying only on the vagal nerve stimulator. G40.009 345.50    5. Fatigue, unspecified type: likely due to stress. R53.83 780.79 CBC WITH AUTOMATED DIFF      METABOLIC PANEL, COMPREHENSIVE   6.      Right knee pain: acute: trauma with swelling; she is on NSAIDS and she will see ortho.      Medications discussed, including all new medications prescribed at this  visit.  Indications and side effects reviewed with the patient/family/caregiver with understanding verbalized      Preventative Measures:   Safety- Safety reviewed; no drunk driving, no texting/phone holding while driving, always use seatbelts and safety equipment  Sunscreen- Reviewed sunscreen use, at least SPF 15 any time of year, if outside more than 5 minutes  Detectors: Patient has smoke detector and carbon monoxide detector  Diet and Exercise: Reviewed a healthy diet and recommended regular exercise 3+ times per week, 30+ minutes per episode  Self exams: Reviewed self breast exam monthly  MMG done 3/16, due 3/17  Gynecology/PAP done 6/16, due 6/17  Screening colonoscopy due 2026  DEXA due now  Vaccinations- tetanus due 2017, shingles vaccine at 52, Pneumovax/Prevnar at 38, recommend flu vaccine in fall    Follow-up 3 months with new PCP      Joan Mayans, MD   12/13/2014

## 2014-12-13 NOTE — Patient Instructions (Signed)
Preventative Measures:   Safety- Safety reviewed; no drunk driving, no texting/phone holding while driving, always use seatbelts and safety equipment  Sunscreen- Reviewed sunscreen use, at least SPF 15 any time of year, if outside more than 5 minutes  Detectors: Patient has smoke detector and carbon monoxide detector  Diet and Exercise: Reviewed a healthy diet and recommended regular exercise 3+ times per week, 30+ minutes per episode  Self exams: Reviewed self breast exam monthly  MMG done 3/16, due 3/17  Gynecology/PAP done 6/16, due 6/17  Screening colonoscopy due 2026  DEXA due now  Vaccinations- tetanus due 2017, shingles vaccine at 60, Pneumovax/Prevnar at 65, recommend flu vaccine in fall    Follow-up 3 months with new PCP

## 2014-12-22 LAB — HEPATITIS C ANTIBODY: HCV Ab: 0.2 s/co ratio (ref 0.0–0.9)

## 2014-12-22 LAB — CBC WITH AUTOMATED DIFF
ABS. BASOPHILS: 0.1 10*3/uL (ref 0.0–0.2)
ABS. EOSINOPHILS: 0.1 10*3/uL (ref 0.0–0.4)
ABS. IMM. GRANS.: 0 10*3/uL (ref 0.0–0.1)
ABS. MONOCYTES: 0.5 10*3/uL (ref 0.1–0.9)
ABS. NEUTROPHILS: 4.3 10*3/uL (ref 1.4–7.0)
Abs Lymphocytes: 2.2 10*3/uL (ref 0.7–3.1)
BASOPHILS: 1 %
EOSINOPHILS: 1 %
HCT: 40.8 % (ref 34.0–46.6)
HGB: 13.5 g/dL (ref 11.1–15.9)
IMMATURE GRANULOCYTES: 0 %
Lymphocytes: 31 %
MCH: 32 pg (ref 26.6–33.0)
MCHC: 33.1 g/dL (ref 31.5–35.7)
MCV: 97 fL (ref 79–97)
MONOCYTES: 7 %
NEUTROPHILS: 60 %
PLATELET: 311 10*3/uL (ref 150–379)
RBC: 4.22 x10E6/uL (ref 3.77–5.28)
RDW: 14 % (ref 12.3–15.4)
WBC: 7.1 10*3/uL (ref 3.4–10.8)

## 2014-12-22 LAB — METABOLIC PANEL, COMPREHENSIVE
A-G Ratio: 1.7 (ref 1.1–2.5)
ALT (SGPT): 18 IU/L (ref 0–32)
AST (SGOT): 16 IU/L (ref 0–40)
Albumin: 4.3 g/dL (ref 3.5–5.5)
Alk. phosphatase: 98 IU/L (ref 39–117)
BUN/Creatinine ratio: 13 (ref 9–23)
BUN: 12 mg/dL (ref 6–24)
Bilirubin, total: 0.3 mg/dL (ref 0.0–1.2)
CO2: 26 mmol/L (ref 18–29)
Calcium: 9.4 mg/dL (ref 8.7–10.2)
Chloride: 104 mmol/L (ref 97–108)
Creatinine: 0.95 mg/dL (ref 0.57–1.00)
GFR est AA: 77 mL/min/{1.73_m2} (ref >59)
GFR est non-AA: 67 mL/min/{1.73_m2} (ref >59)
GLOBULIN, TOTAL: 2.5 g/dL (ref 1.5–4.5)
Glucose: 93 mg/dL (ref 65–99)
Potassium: 4.8 mmol/L (ref 3.5–5.2)
Protein, total: 6.8 g/dL (ref 6.0–8.5)
Sodium: 143 mmol/L (ref 134–144)

## 2014-12-22 LAB — LIPID PANEL
Cholesterol, total: 187 mg/dL (ref 100–199)
HDL Cholesterol: 64 mg/dL (ref >39)
LDL, calculated: 101 mg/dL — ABNORMAL HIGH (ref 0–99)
Triglyceride: 109 mg/dL (ref 0–149)
VLDL, calculated: 22 mg/dL (ref 5–40)

## 2014-12-22 LAB — T4, FREE: T4, Free: 1.27 ng/dL (ref 0.82–1.77)

## 2014-12-22 LAB — TSH 3RD GENERATION: TSH: 2.46 u[IU]/mL (ref 0.450–4.500)

## 2014-12-22 LAB — HEPATITIS C AB: Hep C Virus Ab: 0.2 s/co ratio (ref 0.0–0.9)

## 2014-12-22 NOTE — Progress Notes (Signed)
Quick Note:        Labs are normal. CBC, CMP, Lipid, TSH, Hep C.    ______

## 2014-12-27 ENCOUNTER — Encounter

## 2015-02-27 MED ORDER — AMITRIPTYLINE 25 MG TAB
25 mg | ORAL_TABLET | Freq: Every evening | ORAL | 3 refills | Status: AC
Start: 2015-02-27 — End: ?

## 2015-02-27 NOTE — Telephone Encounter (Signed)
Last seen 12/13/14 annual

## 2015-08-29 DIAGNOSIS — G40909 Epilepsy, unspecified, not intractable, without status epilepticus: Secondary | ICD-10-CM | POA: Insufficient documentation

## 2015-08-29 DIAGNOSIS — G8929 Other chronic pain: Secondary | ICD-10-CM | POA: Insufficient documentation

## 2015-08-29 DIAGNOSIS — M25561 Pain in right knee: Secondary | ICD-10-CM | POA: Insufficient documentation

## 2015-08-29 DIAGNOSIS — E042 Nontoxic multinodular goiter: Secondary | ICD-10-CM | POA: Insufficient documentation

## 2016-01-02 ENCOUNTER — Other Ambulatory Visit: Payer: Self-pay | Admitting: Internal Medicine

## 2016-01-02 DIAGNOSIS — Z1239 Encounter for other screening for malignant neoplasm of breast: Secondary | ICD-10-CM

## 2016-01-28 ENCOUNTER — Ambulatory Visit: Payer: Self-pay

## 2016-02-07 ENCOUNTER — Ambulatory Visit
Admission: RE | Admit: 2016-02-07 | Discharge: 2016-02-07 | Disposition: A | Discharge status: Home / Self Care | Payer: Managed Care, Other (non HMO) | Source: Ambulatory Visit | Attending: Internal Medicine | Admitting: Internal Medicine

## 2016-02-07 DIAGNOSIS — Z1231 Encounter for screening mammogram for malignant neoplasm of breast: Secondary | ICD-10-CM | POA: Insufficient documentation

## 2016-02-07 DIAGNOSIS — Z1239 Encounter for other screening for malignant neoplasm of breast: Secondary | ICD-10-CM

## 2016-05-28 ENCOUNTER — Encounter: Payer: Self-pay | Admitting: *Deleted

## 2016-06-03 ENCOUNTER — Ambulatory Visit: Payer: PRIVATE HEALTH INSURANCE | Admitting: Anesthesiology

## 2016-06-03 ENCOUNTER — Ambulatory Visit
Admission: RE | Admit: 2016-06-03 | Discharge: 2016-06-03 | Disposition: A | Discharge status: Home / Self Care | Payer: PRIVATE HEALTH INSURANCE | Source: Ambulatory Visit | Attending: Ophthalmology | Admitting: Ophthalmology

## 2016-06-03 ENCOUNTER — Encounter: Payer: Self-pay | Admitting: *Deleted

## 2016-06-03 ENCOUNTER — Encounter
Admission: RE | Disposition: A | Discharge status: Home / Self Care | Payer: Self-pay | Source: Ambulatory Visit | Attending: Ophthalmology

## 2016-06-03 DIAGNOSIS — E039 Hypothyroidism, unspecified: Secondary | ICD-10-CM | POA: Insufficient documentation

## 2016-06-03 DIAGNOSIS — K449 Diaphragmatic hernia without obstruction or gangrene: Secondary | ICD-10-CM | POA: Insufficient documentation

## 2016-06-03 DIAGNOSIS — K219 Gastro-esophageal reflux disease without esophagitis: Secondary | ICD-10-CM | POA: Insufficient documentation

## 2016-06-03 DIAGNOSIS — H2511 Age-related nuclear cataract, right eye: Secondary | ICD-10-CM | POA: Diagnosis present

## 2016-06-03 HISTORY — DX: Hypothyroidism, unspecified: E03.9

## 2016-06-03 HISTORY — DX: Unspecified convulsions: R56.9

## 2016-06-03 HISTORY — DX: Gastro-esophageal reflux disease without esophagitis: K21.9

## 2016-06-03 HISTORY — DX: Adverse effect of unspecified anesthetic, initial encounter: T41.45XA

## 2016-06-03 HISTORY — DX: Other specified postprocedural states: Z98.890

## 2016-06-03 HISTORY — DX: Personal history of other diseases of the digestive system: Z87.19

## 2016-06-03 HISTORY — PX: CATARACT EXTRACTION W/PHACO: SHX586

## 2016-06-03 HISTORY — DX: Other complications of anesthesia, initial encounter: T88.59XA

## 2016-06-03 HISTORY — DX: Nausea with vomiting, unspecified: R11.2

## 2016-06-03 SURGERY — PHACOEMULSIFICATION, CATARACT, WITH IOL INSERTION
Anesthesia: Monitor Anesthesia Care | Laterality: Right | Wound class: Clean

## 2016-06-03 MED ORDER — NA CHONDROIT SULF-NA HYALURON 40-17 MG/ML IO SOLN
INTRAOCULAR | Status: DC | PRN
  Administered 2016-06-03: 1 mL via INTRAOCULAR

## 2016-06-03 MED ORDER — POVIDONE-IODINE 5 % OP SOLN
OPHTHALMIC | Status: AC
  Filled 2016-06-03: qty 30

## 2016-06-03 MED ORDER — NA CHONDROIT SULF-NA HYALURON 40-17 MG/ML IO SOLN
INTRAOCULAR | Status: AC
  Filled 2016-06-03: qty 1

## 2016-06-03 MED ORDER — EPINEPHRINE PF 1 MG/ML IJ SOLN
INTRAMUSCULAR | Status: AC
  Filled 2016-06-03: qty 1

## 2016-06-03 MED ORDER — MOXIFLOXACIN HCL 0.5 % OP SOLN
OPHTHALMIC | Status: DC | PRN
  Administered 2016-06-03: 1 [drp] via OPHTHALMIC

## 2016-06-03 MED ORDER — FENTANYL CITRATE (PF) 100 MCG/2ML IJ SOLN
INTRAMUSCULAR | Status: DC | PRN
  Administered 2016-06-03: 50 ug via INTRAVENOUS

## 2016-06-03 MED ORDER — MOXIFLOXACIN HCL 0.5 % OP SOLN: 1.0000 [drp] | OPHTHALMIC | Status: AC

## 2016-06-03 MED ORDER — MOXIFLOXACIN HCL 0.5 % OP SOLN
OPHTHALMIC | Status: AC
  Administered 2016-06-03: 1 [drp] via OPHTHALMIC
  Filled 2016-06-03: qty 3

## 2016-06-03 MED ORDER — ARMC OPHTHALMIC DILATING DROPS
OPHTHALMIC | Status: AC
  Administered 2016-06-03: 1 via OPHTHALMIC
  Filled 2016-06-03: qty 0.4

## 2016-06-03 MED ORDER — LIDOCAINE HCL (PF) 4 % IJ SOLN
INTRAOCULAR | Status: DC | PRN
  Administered 2016-06-03: 1 mL via OPHTHALMIC

## 2016-06-03 MED ORDER — MIDAZOLAM HCL 2 MG/2ML IJ SOLN
INTRAMUSCULAR | Status: DC | PRN
  Administered 2016-06-03: 2 mg via INTRAVENOUS

## 2016-06-03 MED ORDER — ARMC OPHTHALMIC DILATING DROPS
1.0000 "application " | OPHTHALMIC | Status: AC
  Administered 2016-06-03 (×3): 1 via OPHTHALMIC

## 2016-06-03 MED ORDER — EPINEPHRINE PF 1 MG/ML IJ SOLN
INTRAOCULAR | Status: DC | PRN
  Administered 2016-06-03: 250 mL via OPHTHALMIC

## 2016-06-03 MED ORDER — MOXIFLOXACIN HCL 0.5 % OP SOLN
1.0000 [drp] | OPHTHALMIC | Status: AC
  Administered 2016-06-03 (×3): 1 [drp] via OPHTHALMIC

## 2016-06-03 MED ORDER — ARMC OPHTHALMIC DILATING DROPS: 1.0000 "application " | OPHTHALMIC | Status: AC

## 2016-06-03 MED ORDER — LIDOCAINE HCL (PF) 4 % IJ SOLN
INTRAMUSCULAR | Status: AC
  Filled 2016-06-03: qty 5

## 2016-06-03 MED ORDER — CARBACHOL 0.01 % IO SOLN
INTRAOCULAR | Status: DC | PRN
  Administered 2016-06-03: 0.5 mL via INTRAOCULAR

## 2016-06-03 MED ORDER — SODIUM CHLORIDE 0.9 % IV SOLN
INTRAVENOUS | Status: DC
  Administered 2016-06-03: 06:00:00 via INTRAVENOUS

## 2016-06-03 SURGICAL SUPPLY — 21 items
CANNULA ANT/CHMB 27GA (MISCELLANEOUS) ×3 IMPLANT
CUP MEDICINE 2OZ PLAST GRAD ST (MISCELLANEOUS) ×3 IMPLANT
GLOVE BIO SURGEON STRL SZ8 (GLOVE) ×3 IMPLANT
GLOVE BIOGEL M 6.5 STRL (GLOVE) ×3 IMPLANT
GLOVE SURG LX 8.0 MICRO (GLOVE) ×2
GLOVE SURG LX STRL 8.0 MICRO (GLOVE) ×1 IMPLANT
GOWN STRL REUS W/ TWL LRG LVL3 (GOWN DISPOSABLE) ×2 IMPLANT
GOWN STRL REUS W/TWL LRG LVL3 (GOWN DISPOSABLE) ×4
LENS IOL TECNIS ITEC 21.5 (Intraocular Lens) ×3 IMPLANT
PACK CATARACT (MISCELLANEOUS) ×3 IMPLANT
PACK CATARACT BRASINGTON LX (MISCELLANEOUS) ×3 IMPLANT
PACK EYE AFTER SURG (MISCELLANEOUS) ×3 IMPLANT
SOL BSS BAG (MISCELLANEOUS) ×3
SOL PREP PVP 2OZ (MISCELLANEOUS) ×3
SOLUTION BSS BAG (MISCELLANEOUS) ×1 IMPLANT
SOLUTION PREP PVP 2OZ (MISCELLANEOUS) ×1 IMPLANT
SYR 3ML LL SCALE MARK (SYRINGE) ×3 IMPLANT
SYR 5ML LL (SYRINGE) ×3 IMPLANT
SYR TB 1ML 27GX1/2 LL (SYRINGE) ×3 IMPLANT
WATER STERILE IRR 250ML POUR (IV SOLUTION) ×3 IMPLANT
WIPE NON LINTING 3.25X3.25 (MISCELLANEOUS) ×3 IMPLANT

## 2016-06-03 NOTE — H&P (Signed)
All labs reviewed. Abnormal studies sent to patients PCP when indicated.  Previous H&P reviewed, patient examined, there are NO CHANGES.  Angela Bozard LOUIS12/19/20177:20 AM

## 2016-06-03 NOTE — Discharge Instructions (Signed)
Eye Surgery Discharge Instructions  Expect mild scratchy sensation or mild soreness. DO NOT RUB YOUR EYE!  The day of surgery:  Minimal physical activity, but bed rest is not required  No reading, computer work, or close hand work  No bending, lifting, or straining.  May watch TV  For 24 hours:  No driving, legal decisions, or alcoholic beverages  Safety precautions  Eat anything you prefer: It is better to start with liquids, then soup then solid foods.  _____ Eye patch should be worn until postoperative exam tomorrow.  ____ Solar shield eyeglasses should be worn for comfort in the sunlight/patch while sleeping  Resume all regular medications including aspirin or Coumadin if these were discontinued prior to surgery. You may shower, bathe, shave, or wash your hair. Tylenol may be taken for mild discomfort.  Call your doctor if you experience significant pain, nausea, or vomiting, fever > 101 or other signs of infection. [phone] or [phone] Specific instructions:  Follow-up Information    PORFILIO,WILLIAM LOUIS, MD Follow up.   Specialty:  Ophthalmology Why:  December 20 at [facility] information: [address] [place] [address] [phone]

## 2016-06-03 NOTE — Anesthesia Preprocedure Evaluation (Signed)
Anesthesia Evaluation  Patient identified by MRN, date of birth, ID band Patient awake    Reviewed: Allergy & Precautions, H&P , NPO status , Patient's Chart, lab work & pertinent test results  History of Anesthesia Complications (+) PONV and history of anesthetic complications  Airway Mallampati: III  TM Distance: >3 FB Neck ROM: full    Dental  (+) Poor Dentition   Pulmonary neg pulmonary ROS, neg shortness of breath,    Pulmonary exam normal breath sounds clear to auscultation       Cardiovascular Exercise Tolerance: Good (-) angina(-) Past MI and (-) DOE negative cardio ROS Normal cardiovascular exam Rhythm:regular Rate:Normal     Neuro/Psych Seizures -, Well Controlled,  negative psych ROS   GI/Hepatic Neg liver ROS, hiatal hernia, GERD  Controlled,  Endo/Other  Hypothyroidism   Renal/GU      Musculoskeletal   Abdominal   Peds  Hematology negative hematology ROS (+)   Anesthesia Other Findings Past Medical History: No date: Complication of anesthesia No date: GERD (gastroesophageal reflux disease) No date: History of hiatal hernia No date: Hypothyroidism No date: PONV (postoperative nausea and vomiting) No date: Seizures ([place])     Comment: EPILEPSY  Past Surgical History: No date: APPENDECTOMY 1996: AUGMENTATION MAMMAPLASTY     Comment: explanted 2015 2015: BREAST IMPLANT REMOVAL Bilateral No date: CHOLECYSTECTOMY No date: COLON SURGERY     Comment: RESECTION No date: IMPLANTATION VAGAL NERVE STIMULATOR No date: OOPHORECTOMY  BMI    Body Mass Index:  28.73 kg/m      Reproductive/Obstetrics negative OB ROS                             Anesthesia Physical Anesthesia Plan  ASA: III  Anesthesia Plan: MAC   Post-op Pain Management:    Induction:   Airway Management Planned:   Additional Equipment:   Intra-op Plan:   Post-operative Plan:   Informed  Consent: I have reviewed the patients History and Physical, chart, labs and discussed the procedure including the risks, benefits and alternatives for the proposed anesthesia with the patient or authorized representative who has indicated his/her understanding and acceptance.     Plan Discussed with: Anesthesiologist, CRNA and Surgeon  Anesthesia Plan Comments:         Anesthesia Quick Evaluation

## 2016-06-03 NOTE — Transfer of Care (Signed)
Immediate Anesthesia Transfer of Care Note  Patient: Angela Delacruz  Procedure(s) Performed: Procedure(s) with comments: CATARACT EXTRACTION PHACO AND INTRAOCULAR LENS PLACEMENT ([place]) (Right) - LOT [id number] H US:01:02 AP:52.8 CDE:13.78  Patient Location: Short Stay  Anesthesia Type:MAC  Level of Consciousness: awake and alert   Airway & Oxygen Therapy: Patient Spontanous Breathing  Post-op Assessment: Report given to RN and Post -op Vital signs reviewed and stable  Post vital signs: Reviewed  Last Vitals:  Vitals:   06/03/16 0624 06/03/16 0751  BP:  (!) 150/82  Pulse:  71  Resp:  16  Temp: 36.8 C 36.7 C    Last Pain:  Vitals:   06/03/16 0620  TempSrc: Oral         Complications: No apparent anesthesia complications

## 2016-06-03 NOTE — Anesthesia Postprocedure Evaluation (Signed)
Anesthesia Post Note  Patient: Angela Delacruz  Procedure(s) Performed: Procedure(s) (LRB): CATARACT EXTRACTION PHACO AND INTRAOCULAR LENS PLACEMENT ([facility]) (Right)  Patient location during evaluation: PACU Anesthesia Type: MAC Level of consciousness: awake and alert Pain management: pain level controlled Vital Signs Assessment: post-procedure vital signs reviewed and stable Respiratory status: spontaneous breathing, nonlabored ventilation, respiratory function stable and patient connected to nasal cannula oxygen Cardiovascular status: stable and blood pressure returned to baseline Anesthetic complications: no     Last Vitals:  Vitals:   06/03/16 0624 06/03/16 0751  BP:  (!) 150/82  Pulse:  72  Resp:  16  Temp: 36.8 C 36.7 C    Last Pain:  Vitals:   06/03/16 0620  TempSrc: Oral                 Brantley Fling

## 2016-06-03 NOTE — Op Note (Signed)
PREOPERATIVE DIAGNOSIS:  Nuclear sclerotic cataract of the right eye.   POSTOPERATIVE DIAGNOSIS: cataract   OPERATIVE PROCEDURE:  Procedure(s): CATARACT EXTRACTION PHACO AND INTRAOCULAR LENS PLACEMENT ([facility])   SURGEON:  Birder Robson, MD.   ANESTHESIA:  Anesthesiologist: Andria Frames, MD CRNA: Rolla Plate, CRNA  1.      Managed anesthesia care. 2.      Topical tetracaine drops followed by 2% Xylocaine jelly applied in the preoperative holding area.   COMPLICATIONS:  None.   TECHNIQUE:   Stop and chop   DESCRIPTION OF PROCEDURE:  The patient was examined and consented in the preoperative holding area where the aforementioned topical anesthesia was applied to the right eye and then brought back to the Operating Room where the right eye was prepped and draped in the usual sterile ophthalmic fashion and a lid speculum was placed. A paracentesis was created with the side port blade and the anterior chamber was filled with viscoelastic. A near clear corneal incision was performed with the steel keratome. A continuous curvilinear capsulorrhexis was performed with a cystotome followed by the capsulorrhexis forceps. Hydrodissection and hydrodelineation were carried out with BSS on a blunt cannula. The lens was removed in a stop and chop  technique and the remaining cortical material was removed with the irrigation-aspiration handpiece. The capsular bag was inflated with viscoelastic and the Technis ZCB00  lens was placed in the capsular bag without complication. The remaining viscoelastic was removed from the eye with the irrigation-aspiration handpiece. The wounds were hydrated. The anterior chamber was flushed with Miostat and the eye was inflated to physiologic pressure. 0.1 mL of Vigamox was placed in the anterior chamber. The wounds were found to be water tight. The eye was dressed with Vigamox. The patient was given protective glasses to wear throughout the day and a shield with which  to sleep tonight. The patient was also given drops with which to begin a drop regimen today and will follow-up with me in one day.  Implant Name Type Inv. Item Serial No. Manufacturer Lot No. LRB No. Used  LENS IOL DIOP 21.5 - [id number] Intraocular Lens LENS IOL DIOP 21.5 [id number] AMO   Right 1   Procedure(s) with comments: CATARACT EXTRACTION PHACO AND INTRAOCULAR LENS PLACEMENT ([facility]) (Right) - LOT [id number] H US:01:02 AP:52.8 CDE:13.78  Electronically signed: Tor Tsuda LOUIS 06/03/2016 7:49 AM

## 2016-08-28 ENCOUNTER — Encounter: Payer: Self-pay | Admitting: *Deleted

## 2016-09-02 ENCOUNTER — Ambulatory Visit: Payer: PRIVATE HEALTH INSURANCE | Admitting: Anesthesiology

## 2016-09-02 ENCOUNTER — Encounter
Admission: RE | Disposition: A | Discharge status: Home / Self Care | Payer: Self-pay | Source: Ambulatory Visit | Attending: Ophthalmology

## 2016-09-02 ENCOUNTER — Encounter: Payer: Self-pay | Admitting: *Deleted

## 2016-09-02 ENCOUNTER — Ambulatory Visit
Admission: RE | Admit: 2016-09-02 | Discharge: 2016-09-02 | Disposition: A | Discharge status: Home / Self Care | Payer: PRIVATE HEALTH INSURANCE | Source: Ambulatory Visit | Attending: Ophthalmology | Admitting: Ophthalmology

## 2016-09-02 DIAGNOSIS — K449 Diaphragmatic hernia without obstruction or gangrene: Secondary | ICD-10-CM | POA: Insufficient documentation

## 2016-09-02 DIAGNOSIS — G40909 Epilepsy, unspecified, not intractable, without status epilepticus: Secondary | ICD-10-CM | POA: Diagnosis not present

## 2016-09-02 DIAGNOSIS — E039 Hypothyroidism, unspecified: Secondary | ICD-10-CM | POA: Diagnosis not present

## 2016-09-02 DIAGNOSIS — H2512 Age-related nuclear cataract, left eye: Secondary | ICD-10-CM | POA: Insufficient documentation

## 2016-09-02 DIAGNOSIS — K219 Gastro-esophageal reflux disease without esophagitis: Secondary | ICD-10-CM | POA: Insufficient documentation

## 2016-09-02 HISTORY — PX: CATARACT EXTRACTION W/PHACO: SHX586

## 2016-09-02 SURGERY — PHACOEMULSIFICATION, CATARACT, WITH IOL INSERTION
Anesthesia: Monitor Anesthesia Care | Site: Eye | Laterality: Left | Wound class: Clean

## 2016-09-02 MED ORDER — ARMC OPHTHALMIC DILATING DROPS
OPHTHALMIC | Status: AC
  Filled 2016-09-02: qty 0.4

## 2016-09-02 MED ORDER — CEFUROXIME OPHTHALMIC INJECTION 1 MG/0.1 ML
INJECTION | OPHTHALMIC | Status: DC | PRN
  Administered 2016-09-02: 0.1 mL via INTRACAMERAL

## 2016-09-02 MED ORDER — POVIDONE-IODINE 5 % OP SOLN
OPHTHALMIC | Status: DC | PRN
  Administered 2016-09-02: 1 via OPHTHALMIC

## 2016-09-02 MED ORDER — MIDAZOLAM HCL 2 MG/2ML IJ SOLN
INTRAMUSCULAR | Status: AC
  Filled 2016-09-02: qty 2

## 2016-09-02 MED ORDER — SODIUM CHLORIDE 0.9 % IV SOLN
INTRAVENOUS | Status: DC
  Administered 2016-09-02: 09:00:00 via INTRAVENOUS

## 2016-09-02 MED ORDER — BSS IO SOLN
INTRAOCULAR | Status: DC | PRN
  Administered 2016-09-02: 4 mL via OPHTHALMIC

## 2016-09-02 MED ORDER — MIDAZOLAM HCL 2 MG/2ML IJ SOLN
INTRAMUSCULAR | Status: DC | PRN
  Administered 2016-09-02: 2 mg via INTRAVENOUS

## 2016-09-02 MED ORDER — FENTANYL CITRATE (PF) 100 MCG/2ML IJ SOLN
INTRAMUSCULAR | Status: DC | PRN
  Administered 2016-09-02: 50 ug via INTRAVENOUS

## 2016-09-02 MED ORDER — MOXIFLOXACIN HCL 0.5 % OP SOLN
OPHTHALMIC | Status: AC
  Filled 2016-09-02: qty 3

## 2016-09-02 MED ORDER — FENTANYL CITRATE (PF) 100 MCG/2ML IJ SOLN
INTRAMUSCULAR | Status: AC
  Filled 2016-09-02: qty 2

## 2016-09-02 MED ORDER — MOXIFLOXACIN HCL 0.5 % OP SOLN: 1.0000 [drp] | OPHTHALMIC | Status: DC | PRN

## 2016-09-02 MED ORDER — EPINEPHRINE PF 1 MG/ML IJ SOLN
INTRAMUSCULAR | Status: AC
  Filled 2016-09-02: qty 2

## 2016-09-02 MED ORDER — MOXIFLOXACIN HCL 0.5 % OP SOLN
OPHTHALMIC | Status: DC | PRN
  Administered 2016-09-02: 0.2 mL via OPHTHALMIC

## 2016-09-02 MED ORDER — BSS IO SOLN
INTRAOCULAR | Status: DC | PRN
  Administered 2016-09-02: 10:00:00 via OPHTHALMIC

## 2016-09-02 MED ORDER — CARBACHOL 0.01 % IO SOLN
INTRAOCULAR | Status: DC | PRN
  Administered 2016-09-02: 0.5 mL via INTRAOCULAR

## 2016-09-02 MED ORDER — LIDOCAINE HCL (PF) 2 % IJ SOLN
INTRAMUSCULAR | Status: AC
  Filled 2016-09-02: qty 2

## 2016-09-02 MED ORDER — ARMC OPHTHALMIC DILATING DROPS
1.0000 "application " | OPHTHALMIC | Status: AC
  Administered 2016-09-02 (×3): 1 via OPHTHALMIC

## 2016-09-02 MED ORDER — POVIDONE-IODINE 5 % OP SOLN
OPHTHALMIC | Status: AC
  Filled 2016-09-02: qty 30

## 2016-09-02 MED ORDER — NA CHONDROIT SULF-NA HYALURON 40-17 MG/ML IO SOLN
INTRAOCULAR | Status: AC
  Filled 2016-09-02: qty 1

## 2016-09-02 MED ORDER — NA CHONDROIT SULF-NA HYALURON 40-17 MG/ML IO SOLN
INTRAOCULAR | Status: DC | PRN
  Administered 2016-09-02: 1 mL via INTRAOCULAR

## 2016-09-02 SURGICAL SUPPLY — 21 items
CANNULA ANT/CHMB 27GA (MISCELLANEOUS) ×3 IMPLANT
CUP MEDICINE 2OZ PLAST GRAD ST (MISCELLANEOUS) ×3 IMPLANT
GLOVE BIO SURGEON STRL SZ8 (GLOVE) ×3 IMPLANT
GLOVE BIOGEL M 6.5 STRL (GLOVE) ×3 IMPLANT
GLOVE SURG LX 8.0 MICRO (GLOVE) ×2
GLOVE SURG LX STRL 8.0 MICRO (GLOVE) ×1 IMPLANT
GOWN STRL REUS W/ TWL LRG LVL3 (GOWN DISPOSABLE) ×2 IMPLANT
GOWN STRL REUS W/TWL LRG LVL3 (GOWN DISPOSABLE) ×4
LENS IOL TECNIS ITEC 21.0 (Intraocular Lens) ×3 IMPLANT
PACK CATARACT (MISCELLANEOUS) ×3 IMPLANT
PACK CATARACT BRASINGTON LX (MISCELLANEOUS) ×3 IMPLANT
PACK EYE AFTER SURG (MISCELLANEOUS) ×3 IMPLANT
SOL BSS BAG (MISCELLANEOUS) ×3
SOL PREP PVP 2OZ (MISCELLANEOUS) ×3
SOLUTION BSS BAG (MISCELLANEOUS) ×1 IMPLANT
SOLUTION PREP PVP 2OZ (MISCELLANEOUS) ×1 IMPLANT
SYR 3ML LL SCALE MARK (SYRINGE) ×3 IMPLANT
SYR 5ML LL (SYRINGE) ×3 IMPLANT
SYR TB 1ML 27GX1/2 LL (SYRINGE) ×3 IMPLANT
WATER STERILE IRR 250ML POUR (IV SOLUTION) ×3 IMPLANT
WIPE NON LINTING 3.25X3.25 (MISCELLANEOUS) ×3 IMPLANT

## 2016-09-02 NOTE — Anesthesia Preprocedure Evaluation (Signed)
Anesthesia Evaluation  Patient identified by MRN, date of birth, ID band Patient awake    Reviewed: Allergy & Precautions, H&P , NPO status , Patient's Chart, lab work & pertinent test results  History of Anesthesia Complications (+) PONV and history of anesthetic complications  Airway Mallampati: III  TM Distance: >3 FB Neck ROM: full    Dental  (+) Poor Dentition   Pulmonary neg pulmonary ROS, neg shortness of breath,    Pulmonary exam normal breath sounds clear to auscultation       Cardiovascular Exercise Tolerance: Good (-) angina(-) Past MI and (-) DOE negative cardio ROS Normal cardiovascular exam Rhythm:regular Rate:Normal     Neuro/Psych Seizures -, Well Controlled,  negative psych ROS   GI/Hepatic Neg liver ROS, hiatal hernia, GERD  Controlled,  Endo/Other  Hypothyroidism   Renal/GU      Musculoskeletal   Abdominal   Peds  Hematology negative hematology ROS (+)   Anesthesia Other Findings Vagal nerve stimulator.  Patient reports that she leaves this on for procedures, reports no problems doing this.  Past Medical History: No date: Complication of anesthesia No date: GERD (gastroesophageal reflux disease) No date: History of hiatal hernia No date: Hypothyroidism No date: PONV (postoperative nausea and vomiting) No date: Seizures ([place])     Comment: EPILEPSY  Past Surgical History: No date: APPENDECTOMY 1996: AUGMENTATION MAMMAPLASTY     Comment: explanted 2015 2015: BREAST IMPLANT REMOVAL Bilateral No date: CHOLECYSTECTOMY No date: COLON SURGERY     Comment: RESECTION No date: IMPLANTATION VAGAL NERVE STIMULATOR No date: OOPHORECTOMY  BMI    Body Mass Index:  28.73 kg/m      Reproductive/Obstetrics negative OB ROS                             Anesthesia Physical  Anesthesia Plan  ASA: III  Anesthesia Plan: MAC   Post-op Pain Management:    Induction:    Airway Management Planned:   Additional Equipment:   Intra-op Plan:   Post-operative Plan:   Informed Consent: I have reviewed the patients History and Physical, chart, labs and discussed the procedure including the risks, benefits and alternatives for the proposed anesthesia with the patient or authorized representative who has indicated his/her understanding and acceptance.     Plan Discussed with: Anesthesiologist, CRNA and Surgeon  Anesthesia Plan Comments:         Anesthesia Quick Evaluation

## 2016-09-02 NOTE — H&P (Signed)
All labs reviewed. Abnormal studies sent to patients PCP when indicated.  Previous H&P reviewed, patient examined, there are NO CHANGES.  Angela Koo LOUIS3/20/20189:40 AM

## 2016-09-02 NOTE — Discharge Instructions (Signed)

## 2016-09-02 NOTE — Anesthesia Post-op Follow-up Note (Cosign Needed)
Anesthesia QCDR form completed.        

## 2016-09-02 NOTE — Op Note (Signed)
PREOPERATIVE DIAGNOSIS:  Nuclear sclerotic cataract of the left eye.   POSTOPERATIVE DIAGNOSIS:  Nuclear sclerotic cataract of the left eye.   OPERATIVE PROCEDURE: Procedure(s): CATARACT EXTRACTION PHACO AND INTRAOCULAR LENS PLACEMENT ([facility])   SURGEON:  Birder Robson, MD.   ANESTHESIA:  Anesthesiologist: Andria Frames, MD CRNA: Aline Brochure, CRNA  1.      Managed anesthesia care. 2.     0.12ml of Shugarcaine was instilled following the paracentesis   COMPLICATIONS:  None.   TECHNIQUE:   Stop and chop   DESCRIPTION OF PROCEDURE:  The patient was examined and consented in the preoperative holding area where the aforementioned topical anesthesia was applied to the left eye and then brought back to the Operating Room where the left eye was prepped and draped in the usual sterile ophthalmic fashion and a lid speculum was placed. A paracentesis was created with the side port blade and the anterior chamber was filled with viscoelastic. A near clear corneal incision was performed with the steel keratome. A continuous curvilinear capsulorrhexis was performed with a cystotome followed by the capsulorrhexis forceps. Hydrodissection and hydrodelineation were carried out with BSS on a blunt cannula. The lens was removed in a stop and chop  technique and the remaining cortical material was removed with the irrigation-aspiration handpiece. The capsular bag was inflated with viscoelastic and the Technis [id number] lens was placed in the capsular bag without complication. The remaining viscoelastic was removed from the eye with the irrigation-aspiration handpiece. The wounds were hydrated. The anterior chamber was flushed with Miostat and the eye was inflated to physiologic pressure. 0.14ml Vigamox was placed in the anterior chamber. The wounds were found to be water tight. The eye was dressed with Vigamox. The patient was given protective glasses to wear throughout the day and a shield with which to sleep  tonight. The patient was also given drops with which to begin a drop regimen today and will follow-up with me in one day.  Implant Name Type Inv. Item Serial No. Manufacturer Lot No. LRB No. Used  LENS IOL DIOP 21.0 - [id number] Intraocular Lens LENS IOL DIOP 21.0 [id number] AMO   Left 1    Procedure(s) with comments: CATARACT EXTRACTION PHACO AND INTRAOCULAR LENS PLACEMENT ([facility]) (Left) - [place] 54.6 AP% 16.8 CDE 9.17 Fluid pack lot # [id number]  Electronically signed: [place] 09/02/2016 10:06 AM

## 2016-09-02 NOTE — Anesthesia Postprocedure Evaluation (Signed)
Anesthesia Post Note  Patient: Angela Delacruz  Procedure(s) Performed: Procedure(s) (LRB): CATARACT EXTRACTION PHACO AND INTRAOCULAR LENS PLACEMENT ([facility]) (Left)  Patient location during evaluation: PACU Anesthesia Type: MAC Level of consciousness: awake and alert and oriented Pain management: pain level controlled Vital Signs Assessment: post-procedure vital signs reviewed and stable Respiratory status: spontaneous breathing Cardiovascular status: stable Postop Assessment: no signs of nausea or vomiting Anesthetic complications: no     Last Vitals:  Vitals:   09/02/16 1004 09/02/16 1008  BP: (!) 152/73 (!) 152/73  Pulse: 78 77  Resp: 16 12  Temp: 36.8 C 36.8 C    Last Pain:  Vitals:   09/02/16 1008  TempSrc: Temporal                 Journey Castonguay,  Clearnce Sorrel

## 2016-09-02 NOTE — Transfer of Care (Signed)
Immediate Anesthesia Transfer of Care Note  Patient: Angela Delacruz  Procedure(s) Performed: Procedure(s) with comments: CATARACT EXTRACTION PHACO AND INTRAOCULAR LENS PLACEMENT (IOC) (Left) - [place] 54.6 AP% 16.8 CDE 9.17 Fluid pack lot # [id number]  Patient Location: PACU  Anesthesia Type:MAC  Level of Consciousness: awake, alert  and oriented  Airway & Oxygen Therapy: Patient Spontanous Breathing  Post-op Assessment: Post -op Vital signs reviewed and stable  Post vital signs: stable  Last Vitals:  Vitals:   09/02/16 0853 09/02/16 1008  BP: (!) 137/92 (!) 152/73  Pulse: 86 77  Resp: 18 12  Temp: 36.7 C 36.8 C    Last Pain:  Vitals:   09/02/16 1008  TempSrc: Temporal         Complications: No apparent anesthesia complications

## 2016-09-03 ENCOUNTER — Encounter: Payer: Self-pay | Admitting: Ophthalmology

## 2016-10-28 DIAGNOSIS — R030 Elevated blood-pressure reading, without diagnosis of hypertension: Secondary | ICD-10-CM | POA: Insufficient documentation

## 2016-11-10 DIAGNOSIS — M778 Other enthesopathies, not elsewhere classified: Secondary | ICD-10-CM | POA: Insufficient documentation

## 2016-11-10 DIAGNOSIS — G8929 Other chronic pain: Secondary | ICD-10-CM | POA: Insufficient documentation

## 2016-12-31 ENCOUNTER — Other Ambulatory Visit: Payer: Self-pay | Admitting: Internal Medicine

## 2016-12-31 DIAGNOSIS — Z1231 Encounter for screening mammogram for malignant neoplasm of breast: Secondary | ICD-10-CM

## 2017-02-02 ENCOUNTER — Other Ambulatory Visit: Payer: Self-pay | Admitting: Orthopedic Surgery

## 2017-02-02 DIAGNOSIS — M25561 Pain in right knee: Secondary | ICD-10-CM

## 2017-02-12 ENCOUNTER — Ambulatory Visit
Admission: RE | Admit: 2017-02-12 | Discharge: 2017-02-12 | Disposition: A | Discharge status: Home / Self Care | Payer: PRIVATE HEALTH INSURANCE | Source: Ambulatory Visit | Attending: Internal Medicine | Admitting: Internal Medicine

## 2017-02-12 ENCOUNTER — Other Ambulatory Visit: Payer: Self-pay | Admitting: Orthopedic Surgery

## 2017-02-12 DIAGNOSIS — R928 Other abnormal and inconclusive findings on diagnostic imaging of breast: Secondary | ICD-10-CM | POA: Insufficient documentation

## 2017-02-12 DIAGNOSIS — Z1231 Encounter for screening mammogram for malignant neoplasm of breast: Secondary | ICD-10-CM | POA: Diagnosis present

## 2017-02-12 DIAGNOSIS — M25561 Pain in right knee: Secondary | ICD-10-CM

## 2017-02-13 DIAGNOSIS — R5382 Chronic fatigue, unspecified: Secondary | ICD-10-CM | POA: Insufficient documentation

## 2017-02-13 DIAGNOSIS — M2550 Pain in unspecified joint: Secondary | ICD-10-CM | POA: Insufficient documentation

## 2017-02-13 DIAGNOSIS — G8929 Other chronic pain: Secondary | ICD-10-CM | POA: Insufficient documentation

## 2017-02-17 ENCOUNTER — Other Ambulatory Visit: Payer: Self-pay | Admitting: Internal Medicine

## 2017-02-18 ENCOUNTER — Other Ambulatory Visit: Payer: Self-pay | Admitting: Internal Medicine

## 2017-02-18 DIAGNOSIS — R928 Other abnormal and inconclusive findings on diagnostic imaging of breast: Secondary | ICD-10-CM

## 2017-02-18 DIAGNOSIS — N6310 Unspecified lump in the right breast, unspecified quadrant: Secondary | ICD-10-CM

## 2017-03-02 ENCOUNTER — Ambulatory Visit
Admission: RE | Admit: 2017-03-02 | Discharge: 2017-03-02 | Disposition: A | Discharge status: Home / Self Care | Payer: PRIVATE HEALTH INSURANCE | Source: Ambulatory Visit | Attending: Internal Medicine | Admitting: Internal Medicine

## 2017-03-02 DIAGNOSIS — N6310 Unspecified lump in the right breast, unspecified quadrant: Secondary | ICD-10-CM

## 2017-03-02 DIAGNOSIS — R928 Other abnormal and inconclusive findings on diagnostic imaging of breast: Secondary | ICD-10-CM

## 2017-03-02 DIAGNOSIS — N6001 Solitary cyst of right breast: Secondary | ICD-10-CM | POA: Insufficient documentation

## 2017-03-10 IMAGING — US ULTRASOUND RIGHT BREAST LIMITED
1 series · 13 of 15 positions shown · non-contrast
Comparison: Previous exam(s).

CLINICAL DATA: Callback from screening mammogram for possible right
breast mass

EXAM:
2D DIGITAL DIAGNOSTIC RIGHT MAMMOGRAM WITH CAD AND ADJUNCT TOMO
ULTRASOUND RIGHT BREAST

[Series 1: ultrasound right breast limited · 0.07mm/px · 13 of 15 slices shown]
[im 1/15]
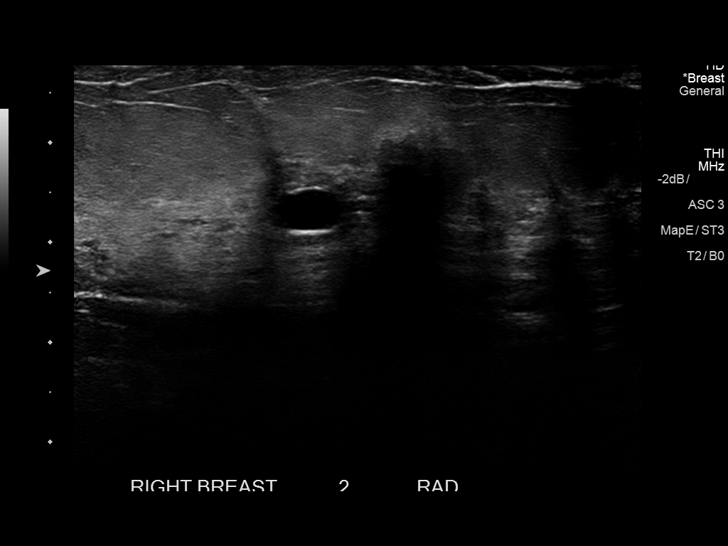
[im 2/15]
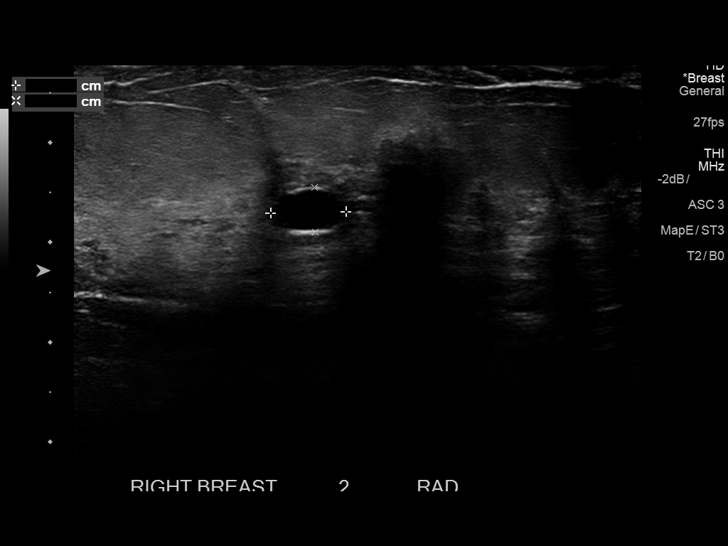
[im 3/15]
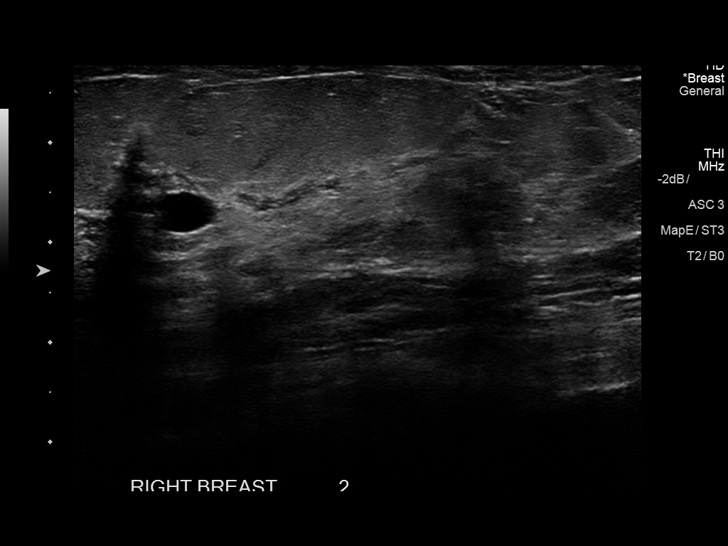
[im 5/15]
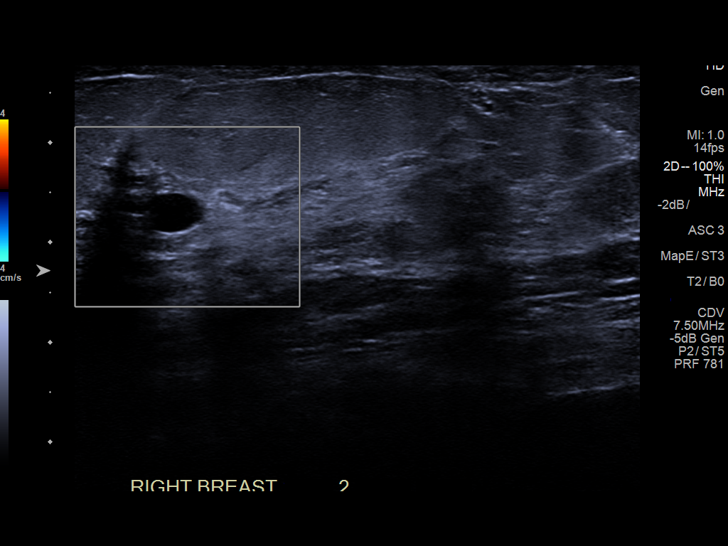
[im 6/15]
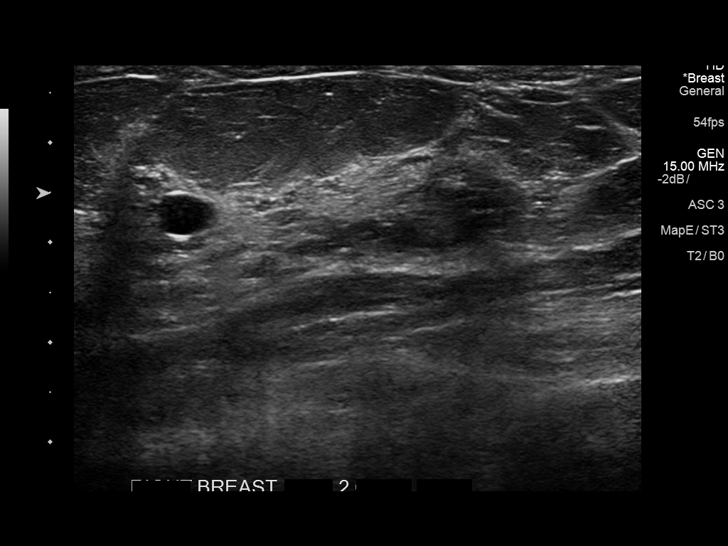
[im 7/15]
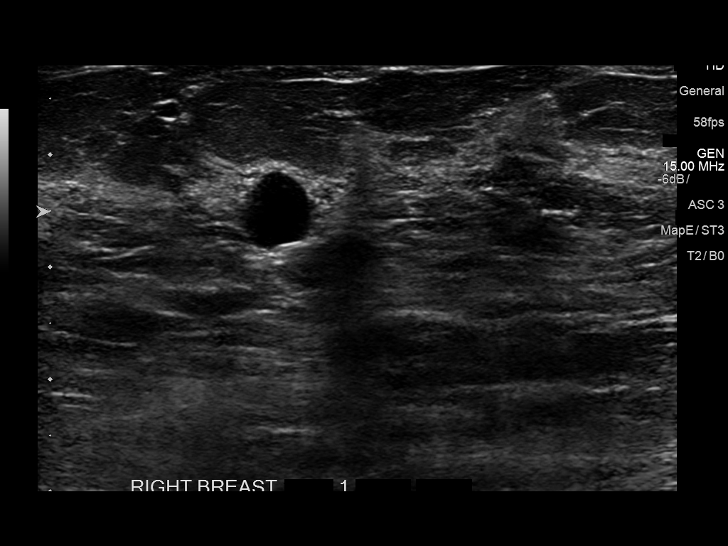
[im 8/15]
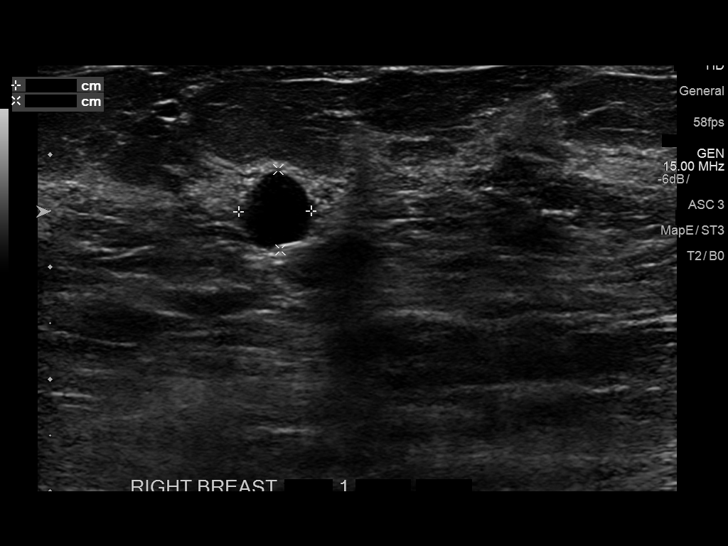
[im 9/15]
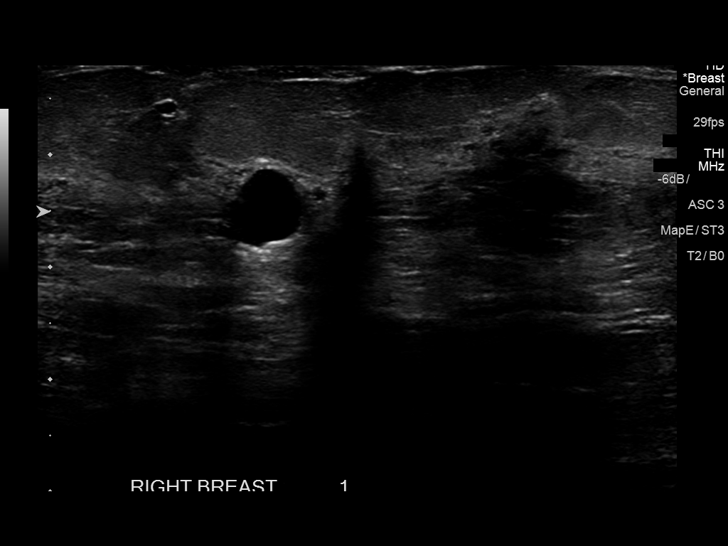
[im 10/15]
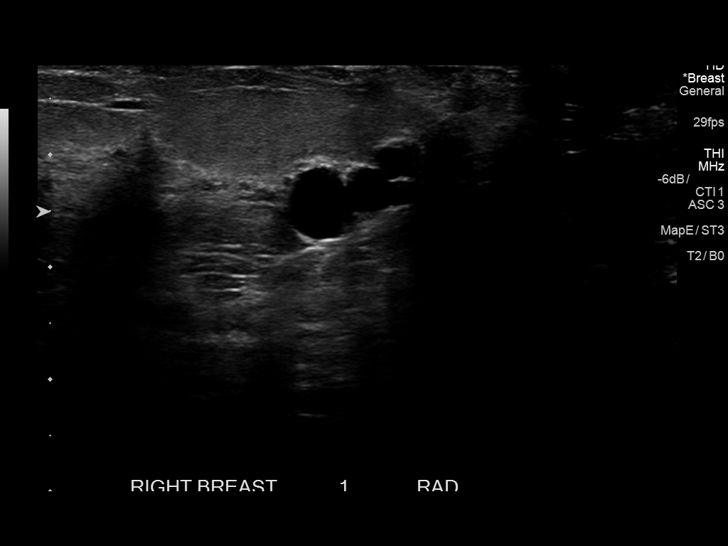
[im 11/15]
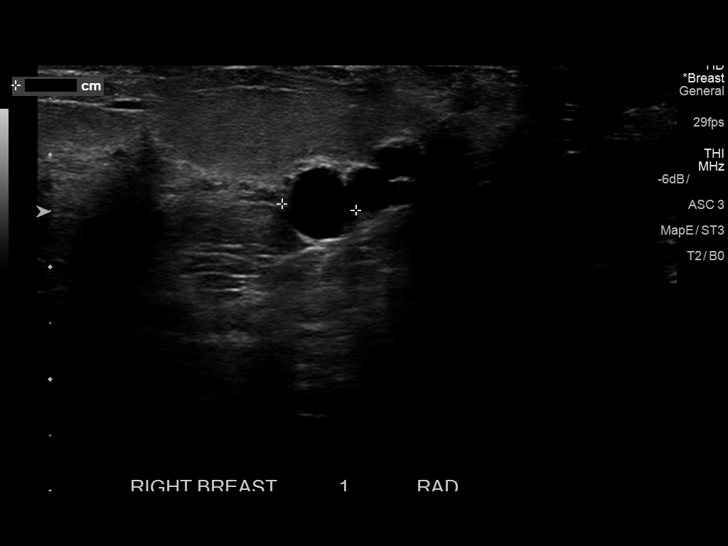
[im 13/15]
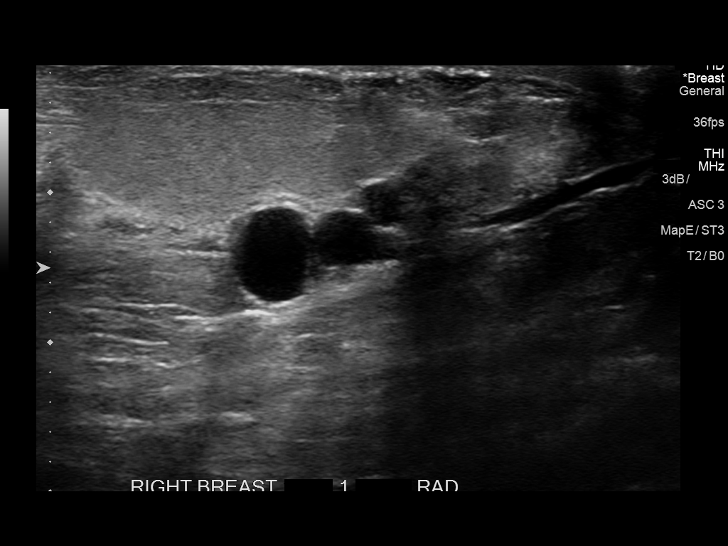
[im 14/15]
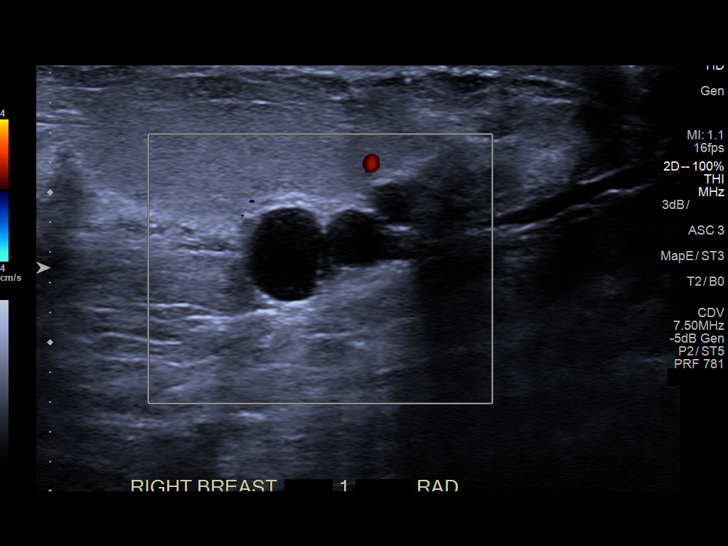
[im 15/15]
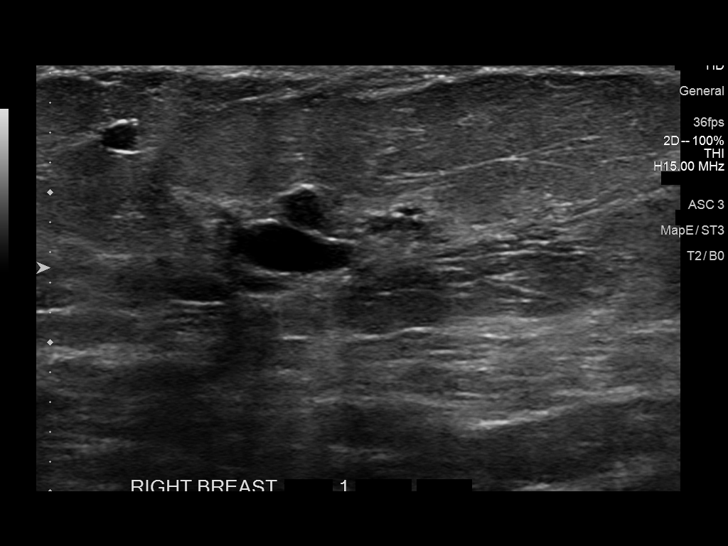

[13 of 15 positions shown; findings below may reference images not displayed]

ACR Breast Density Category c: The breast tissue is heterogeneously
dense, which may obscure small masses.
FINDINGS: Cc and MLO views of the right breast were performed with
tomosynthesis. On the additional views, there is approximately 7 mm
oval, circumscribed mass within the upper, outer right breast,
anterior depth, corresponding to the finding seen on screening
mammogram. Two adjacent smaller oval, circumscribed masses are also
noted. There is an additional oval, circumscribed mass within the
superior, central right breast, middle depth measuring approximately
7 mm.

Mammographic images were processed with CAD.

Targeted ultrasound of the right breast demonstrates an oval,
circumscribed, anechoic mass at 11 o'clock, 1 cm from the nipple
measuring 7 x 7 x 7 mm, corresponding to the mass seen on screening
mammogram. Two smaller adjacent oval, circumscribed masses are also
noted, consistent with additional simple cysts. At 12 o'clock, 2 cm
from the nipple, there is an oval, circumscribed, anechoic mass
measuring 6 x 8 x 5 mm, corresponding to the additional mass seen
mammographically. All findings are consistent with simple cysts. No
internal vascularity was identified within any of the masses.
IMPRESSION: Right breast cysts. No mammographic or sonographic evidence of
malignancy.

RECOMMENDATION:
Screening mammogram in one year.(Code:[7B])

I have discussed the findings and recommendations with the patient.
Results were also provided in writing at the conclusion of the
visit. If applicable, a reminder letter will be sent to the patient
regarding the next appointment.

BI-RADS CATEGORY  2: Benign.

## 2017-04-01 DIAGNOSIS — M1711 Unilateral primary osteoarthritis, right knee: Secondary | ICD-10-CM | POA: Insufficient documentation

## 2017-06-29 DIAGNOSIS — N289 Disorder of kidney and ureter, unspecified: Secondary | ICD-10-CM | POA: Insufficient documentation

## 2017-06-29 DIAGNOSIS — E039 Hypothyroidism, unspecified: Secondary | ICD-10-CM | POA: Insufficient documentation

## 2017-06-29 DIAGNOSIS — M8589 Other specified disorders of bone density and structure, multiple sites: Secondary | ICD-10-CM | POA: Insufficient documentation

## 2017-12-09 DIAGNOSIS — D329 Benign neoplasm of meninges, unspecified: Secondary | ICD-10-CM | POA: Insufficient documentation

## 2018-01-22 ENCOUNTER — Encounter: Payer: PRIVATE HEALTH INSURANCE | Attending: Neurology | Admitting: Dietician

## 2018-01-22 ENCOUNTER — Encounter: Payer: Self-pay | Admitting: Dietician

## 2018-01-22 VITALS — Ht 66.0 in | Wt 194.2 lb

## 2018-01-22 DIAGNOSIS — R569 Unspecified convulsions: Secondary | ICD-10-CM | POA: Diagnosis not present

## 2018-01-22 DIAGNOSIS — Z713 Dietary counseling and surveillance: Secondary | ICD-10-CM | POA: Diagnosis present

## 2018-01-22 NOTE — Patient Instructions (Addendum)
   Use MyFitness Pal to look up foods and track carb intake at first. You can use this as a tool to look up foods to assess for grams of carbohydrate  Aim for 15-20g of carbs per day, eat fat and protein as you wish  Read nutrition facts labels! Also remember to subtract the grams of fiber from the total grams of carbohydrates to get "net" carbs  Anything that ends in "ose" on a nutrition label is a form of sugar  Protein powder: Isolate (will not have carbs / and shouldn't have added sugars)  Try to still include fruits and vegetables into your week- health matters too!  Follow the Modified Atkins diet for 2-3 months, at which time you can reassess and possibly add in 5-10g of carbs every month or so depending on results Email: [email or web address]

## 2018-01-22 NOTE — Progress Notes (Signed)
Medical Nutrition Therapy: Visit start time: 2952  end time: 1645 Assessment:  Diagnosis: epilepsy  Past medical history: Fatigue, Diverticulitis Psychosocial issues/ stress concerns: none  Preferred learning method:  . Hands-on  Current weight: 194.2#  Height: 5\' 6"  Medications, supplements: Keppra, Synthroid, Amitriptyline, Buspirone, Omeprazole, Aspirin, Calcium, Vitamin D  Progress and evaluation: Patient would like to be educated on the ketogenic diet/ modified adkin's diet in order to reduce and hopefully eliminate recurring seizures. She has not tried any other dietary interventions in an attempt to treat epilepsy prior to this. Her seizures may be dormant for months, and then occur in clusters for a period of time. She suspects they are stress-related. Current diet is high in starches and sugars. Consumes mostly white meats. They do not eat out often, with the exception of the past couple of weeks.   Physical activity: yard work and stands all day at work  Dietary Intake:  Usual eating pattern includes 3 meals and 1-3 snacks per day. Dining out frequency: 5 meals per week  Breakfast: oatmeal + apples & cinnamon, french toast & bacon, muffin Snack: not usually Lunch: leftovers (stew, soup, pasta, salad, grilled cheese, chicken breast) Snack: cookies, candy Supper: similar to lunch, or they may go out to eat to restaurants Snack: ice cream Beverages: coffee with cream and sugar, green tea, water, soda or sweet tea only when out to eat  Nutrition Care Education: Topics covered: carb counting, fiber and net carbs, ketogenic/ modified atkin's diet parameters, foods and beverages that contain carbohydrates, sources of dietary fat and protein, identifying sugar in the ingredient list, using meal tracking to consume accurate amount of CHO daily in order to stay in ketosis Basic nutrition: basic food groups, appropriate nutrient balance, appropriate meal and snack schedule, general  nutrition guidelines    Weight control: behavioral changes for weight loss Advanced nutrition:  recipe modification, cooking techniques, dining out, food label reading Other lifestyle changes: benefits of making changes, readiness for change, identifying habits that need to change, alcohol use  Nutritional Diagnosis:  NI-5.1 Increased nutrient needs (specify): dietary fat As related to dx of epilepsy.  As evidenced by recommendation for patient to follow a ketogenic-style diet in order to reduce or elmination seizure activity .  Intervention: Discussion as noted above. She will begin following the Modified Adkin's diet tomorrow, and become proficient at identifying sources of carbohydrates in all food and beverages. She will aim to eat no more than 15-20g of CHO per day, with a large focus on dietary fat and moderate focus on dietary protein (35% of diet ideally). This should be continued for 2-3 months, at which time it may be advantageous to add in an additional 5-10g of CHO per day for another couple of months, increasing CHO intake as tolerated based on seizure activity or lack there of. We will reassess in one month's time to assess for diet adherence.  Education Materials given:  . Food lists for CHO servings/ grams . Modified Adkins Diet for epilepsy management guidelines . Goals/ instructions  Learner/ who was taught:  . Patient   Level of understanding: . Partial understanding; needs review/ practice  Demonstrated degree of understanding via:   Teach back Learning barriers: . None  Willingness to learn/ readiness for change: . Acceptance, ready for change  Monitoring and Evaluation:  Dietary intake, dietary adherence, and body weight      follow up: in 3 week(s): August 30th

## 2018-02-12 ENCOUNTER — Encounter: Payer: PRIVATE HEALTH INSURANCE | Admitting: Dietician

## 2018-03-22 ENCOUNTER — Other Ambulatory Visit: Payer: Self-pay | Admitting: Internal Medicine

## 2018-03-22 DIAGNOSIS — Z1231 Encounter for screening mammogram for malignant neoplasm of breast: Secondary | ICD-10-CM

## 2018-04-05 ENCOUNTER — Ambulatory Visit
Admission: RE | Admit: 2018-04-05 | Discharge: 2018-04-05 | Disposition: A | Discharge status: Home / Self Care | Payer: PRIVATE HEALTH INSURANCE | Source: Ambulatory Visit | Attending: Internal Medicine | Admitting: Internal Medicine

## 2018-04-05 ENCOUNTER — Other Ambulatory Visit: Payer: Self-pay | Admitting: Internal Medicine

## 2018-04-05 DIAGNOSIS — Z1231 Encounter for screening mammogram for malignant neoplasm of breast: Secondary | ICD-10-CM | POA: Insufficient documentation

## 2018-04-05 IMAGING — MG DIGITAL SCREENING BILATERAL MAMMOGRAM WITH TOMO AND CAD
8 series · 8 of 24 positions shown · non-contrast
Comparison: Previous exam(s).

CLINICAL DATA: Screening.

EXAM:
DIGITAL SCREENING BILATERAL MAMMOGRAM WITH TOMO AND CAD

[L CC synth-2D]
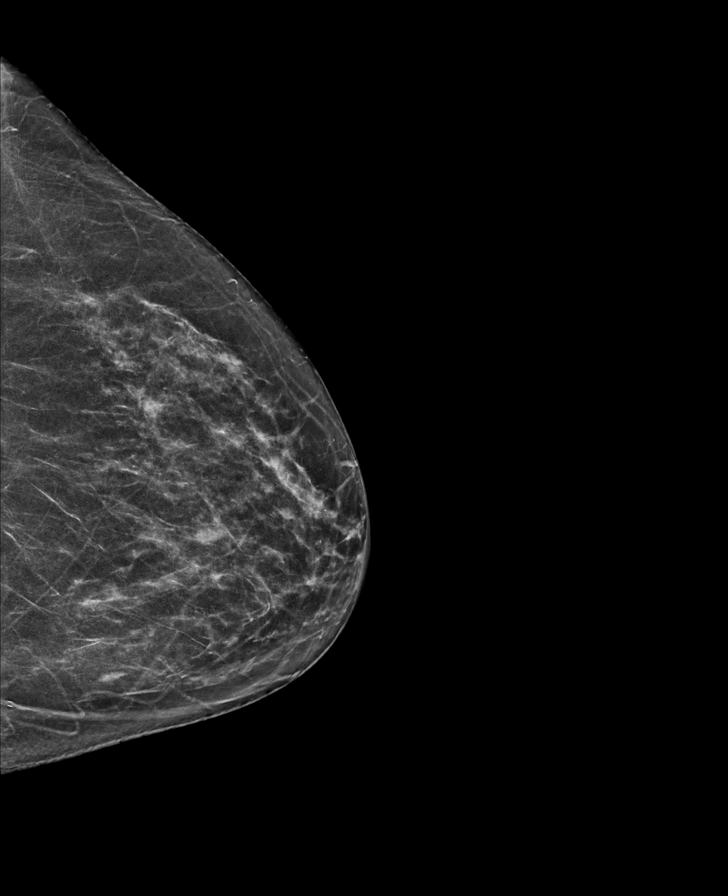

[R CC synth-2D]
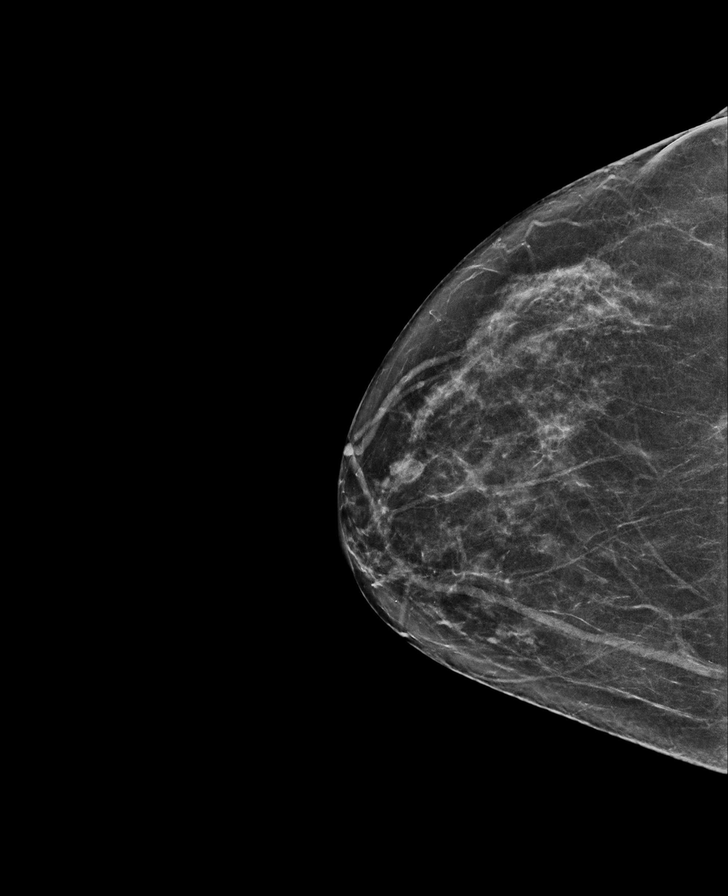

[L MLO synth-2D]
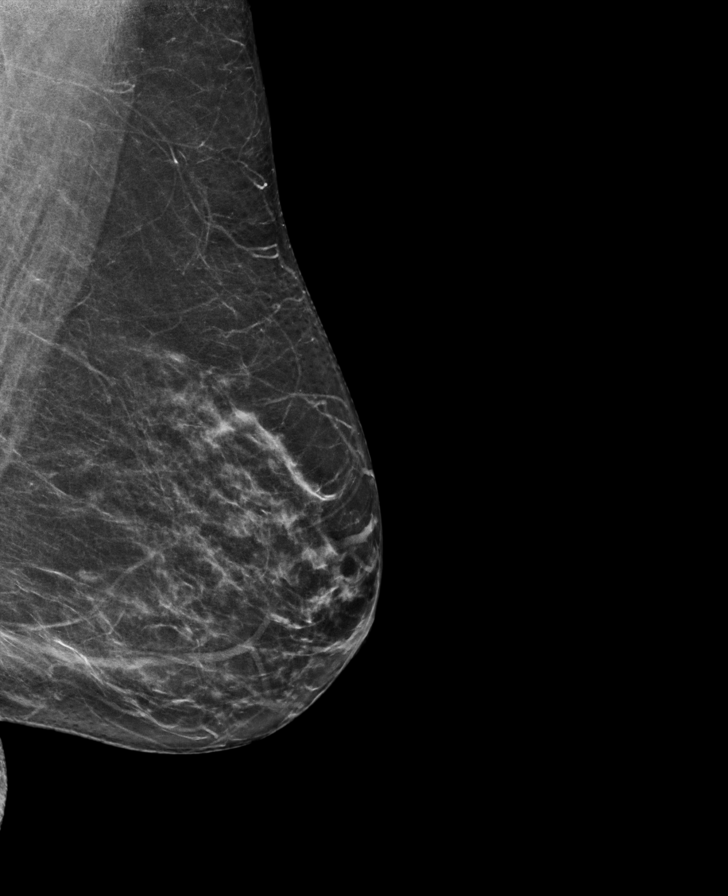

[R MLO synth-2D]
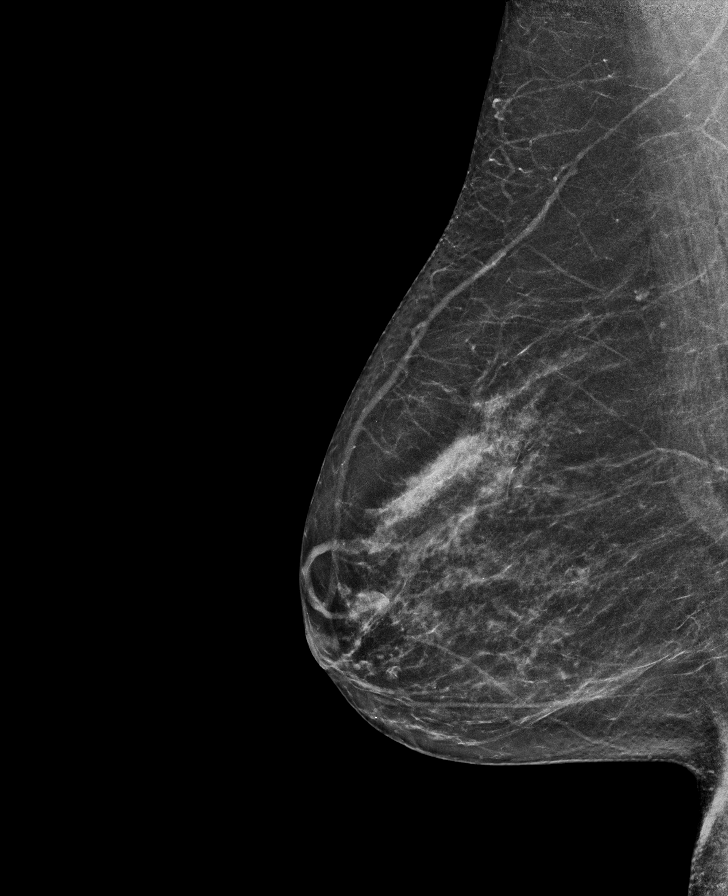

[R MLO tomo · tomo slice 31/60.0]
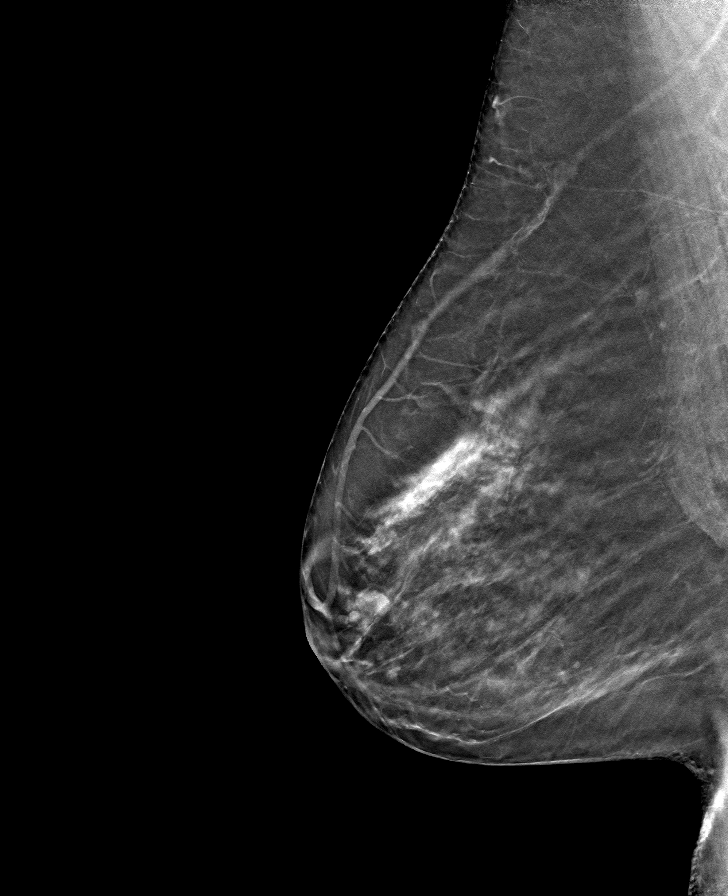

[L CC tomo · tomo slice 31/62.0]
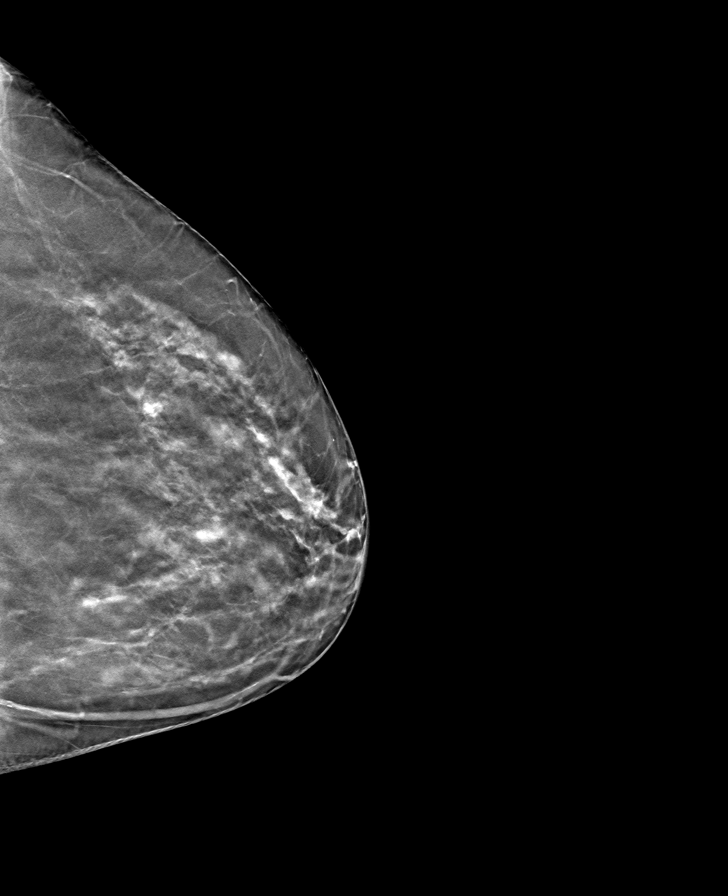

[L MLO tomo · tomo slice 33/64.0]
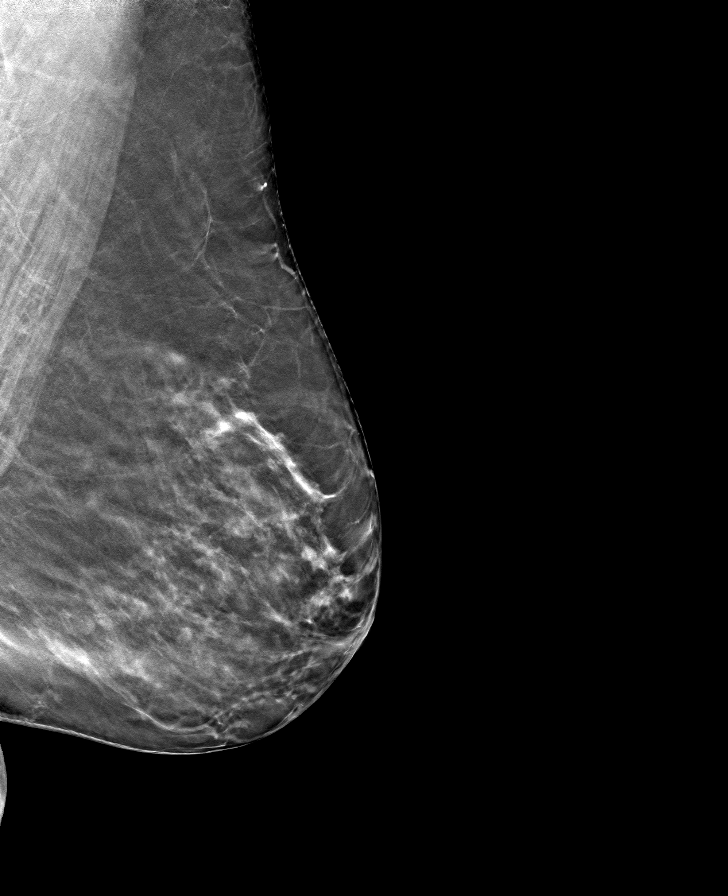

[R CC tomo · tomo slice 32/63.0]
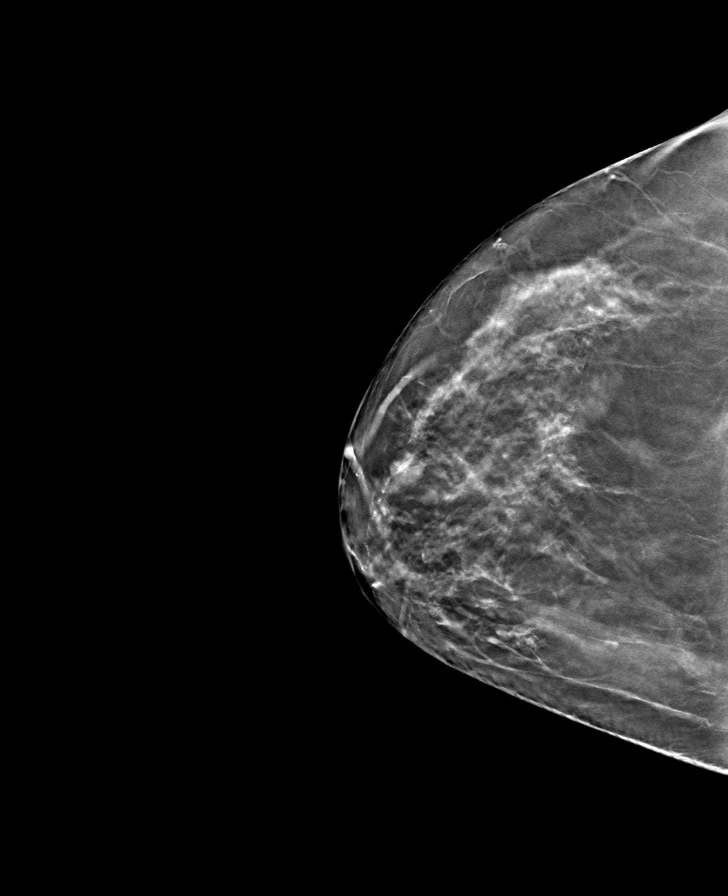

[8 of 24 positions shown; findings below may reference images not displayed]

ACR Breast Density Category b: There are scattered areas of
fibroglandular density.
FINDINGS: There are no findings suspicious for malignancy. Images were
processed with CAD.
IMPRESSION: No mammographic evidence of malignancy. A result letter of this
screening mammogram will be mailed directly to the patient.

RECOMMENDATION:
Screening mammogram in one year. (Code:[TQ])

BI-RADS CATEGORY  1: Negative.

## 2018-10-14 ENCOUNTER — Emergency Department: Payer: PRIVATE HEALTH INSURANCE

## 2018-10-14 ENCOUNTER — Emergency Department
Admission: EM | Admit: 2018-10-14 | Discharge: 2018-10-14 | Disposition: A | Discharge status: Home / Self Care | Payer: PRIVATE HEALTH INSURANCE | Attending: Emergency Medicine | Admitting: Emergency Medicine

## 2018-10-14 ENCOUNTER — Other Ambulatory Visit: Payer: Self-pay

## 2018-10-14 DIAGNOSIS — R0789 Other chest pain: Secondary | ICD-10-CM | POA: Insufficient documentation

## 2018-10-14 DIAGNOSIS — Z79899 Other long term (current) drug therapy: Secondary | ICD-10-CM | POA: Diagnosis not present

## 2018-10-14 DIAGNOSIS — R079 Chest pain, unspecified: Secondary | ICD-10-CM

## 2018-10-14 DIAGNOSIS — K219 Gastro-esophageal reflux disease without esophagitis: Secondary | ICD-10-CM | POA: Insufficient documentation

## 2018-10-14 LAB — BASIC METABOLIC PANEL
Anion gap: 11 (ref 5–15)
BUN: 13 mg/dL (ref 6–20)
CO2: 24 mmol/L (ref 22–32)
Calcium: 8.9 mg/dL (ref 8.9–10.3)
Chloride: 105 mmol/L (ref 98–111)
Creatinine, Ser: 1.02 mg/dL — ABNORMAL HIGH (ref 0.44–1.00)
GFR calc Af Amer: >60 mL/min (ref >60)
GFR calc non Af Amer: 60 mL/min — ABNORMAL LOW (ref >60)
Glucose, Bld: 82 mg/dL (ref 70–99)
Potassium: 4 mmol/L (ref 3.5–5.1)
Sodium: 140 mmol/L (ref 135–145)

## 2018-10-14 LAB — CBC
HCT: 39.4 % (ref 36.0–46.0)
Hemoglobin: 13 g/dL (ref 12.0–15.0)
MCH: 30.2 pg (ref 26.0–34.0)
MCHC: 33 g/dL (ref 30.0–36.0)
MCV: 91.4 fL (ref 80.0–100.0)
Platelets: 288 10*3/uL (ref 150–400)
RBC: 4.31 MIL/uL (ref 3.87–5.11)
RDW: 12.5 % (ref 11.5–15.5)
WBC: 8.2 10*3/uL (ref 4.0–10.5)
nRBC: 0 % (ref 0.0–0.2)

## 2018-10-14 LAB — TROPONIN I: Troponin I: <0.03 ng/mL (ref <0.03)

## 2018-10-14 IMAGING — CR CHEST - 2 VIEW
2 series · 2 of 2 positions shown · non-contrast
Comparison: None.

CLINICAL DATA: Intermittent central chest pain radiating to the
back over the last several months.

EXAM:
CHEST - 2 VIEW

[chest pa]
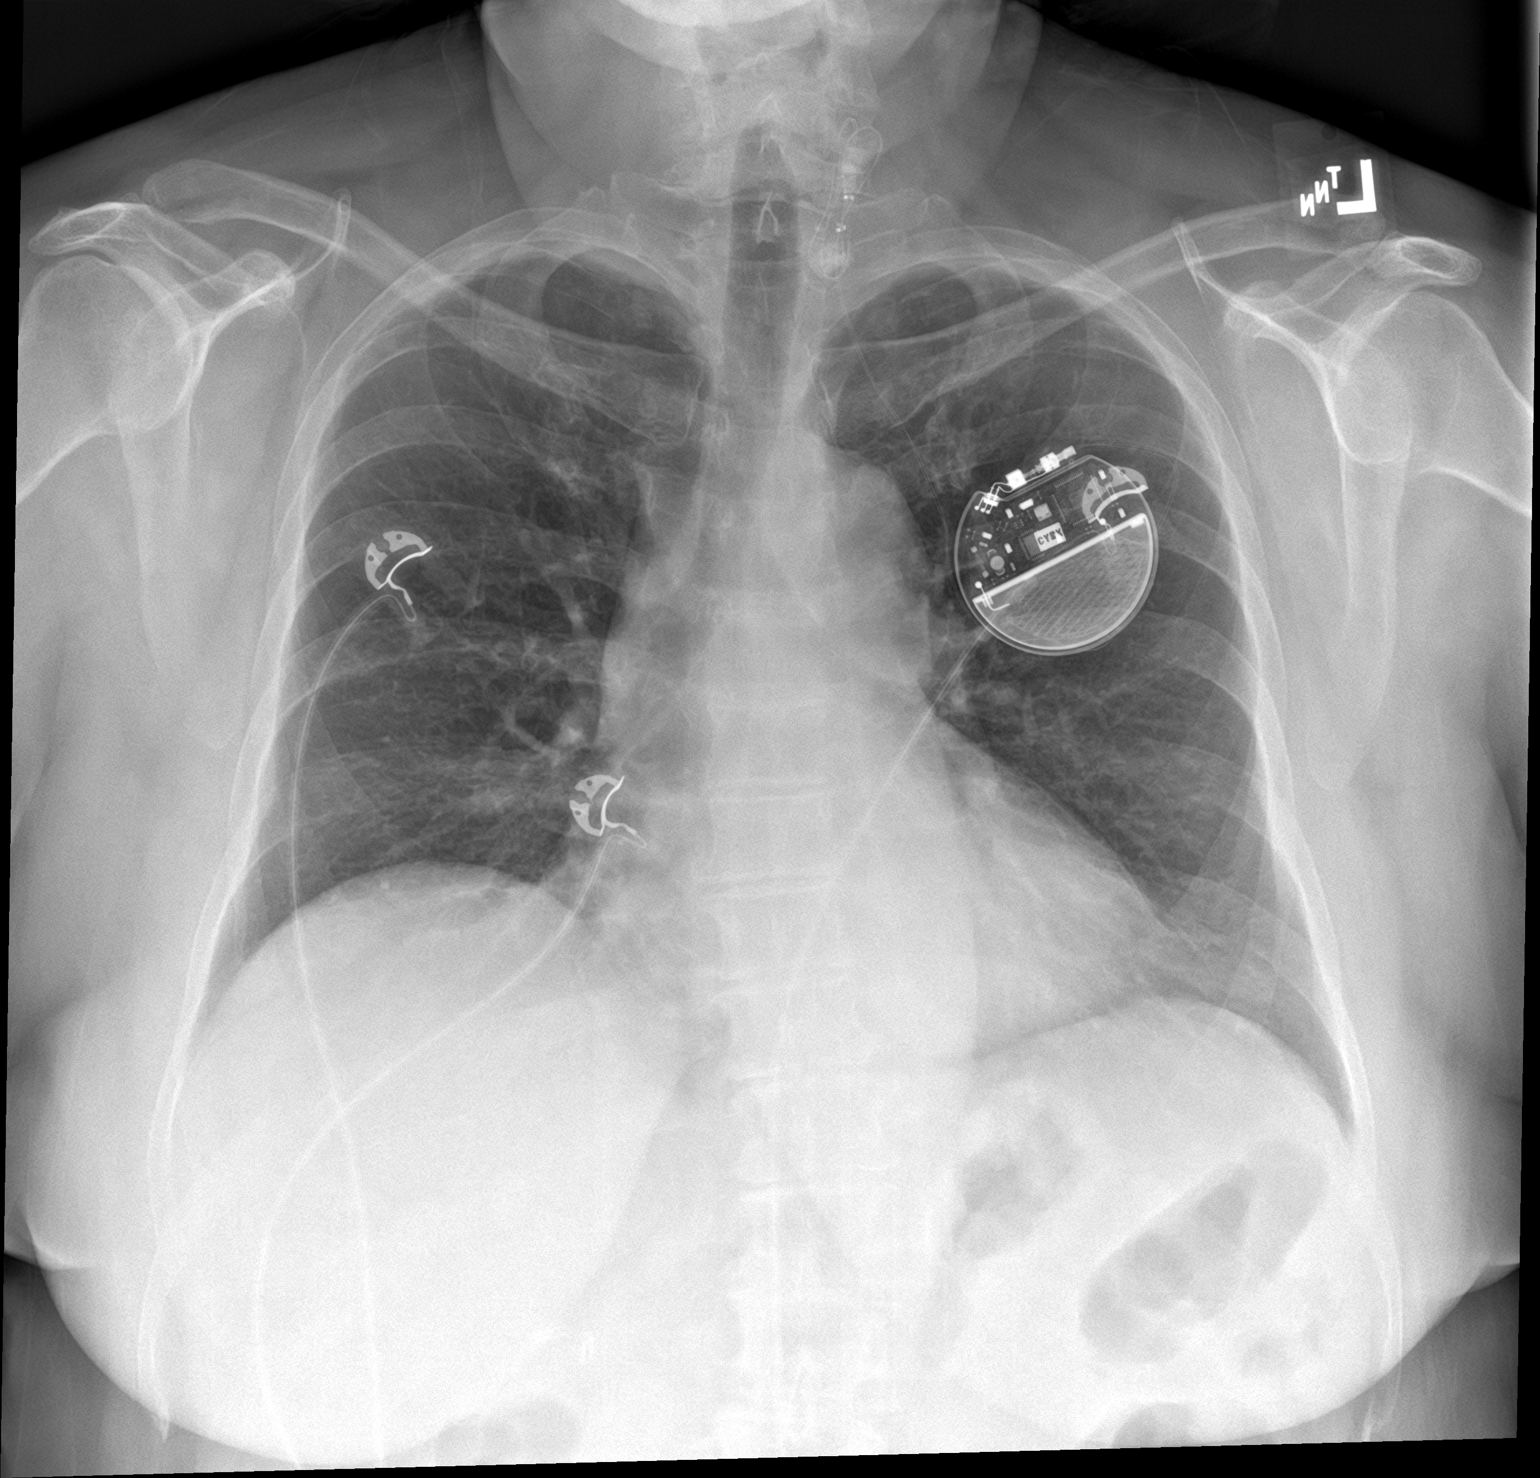

[chest lat]
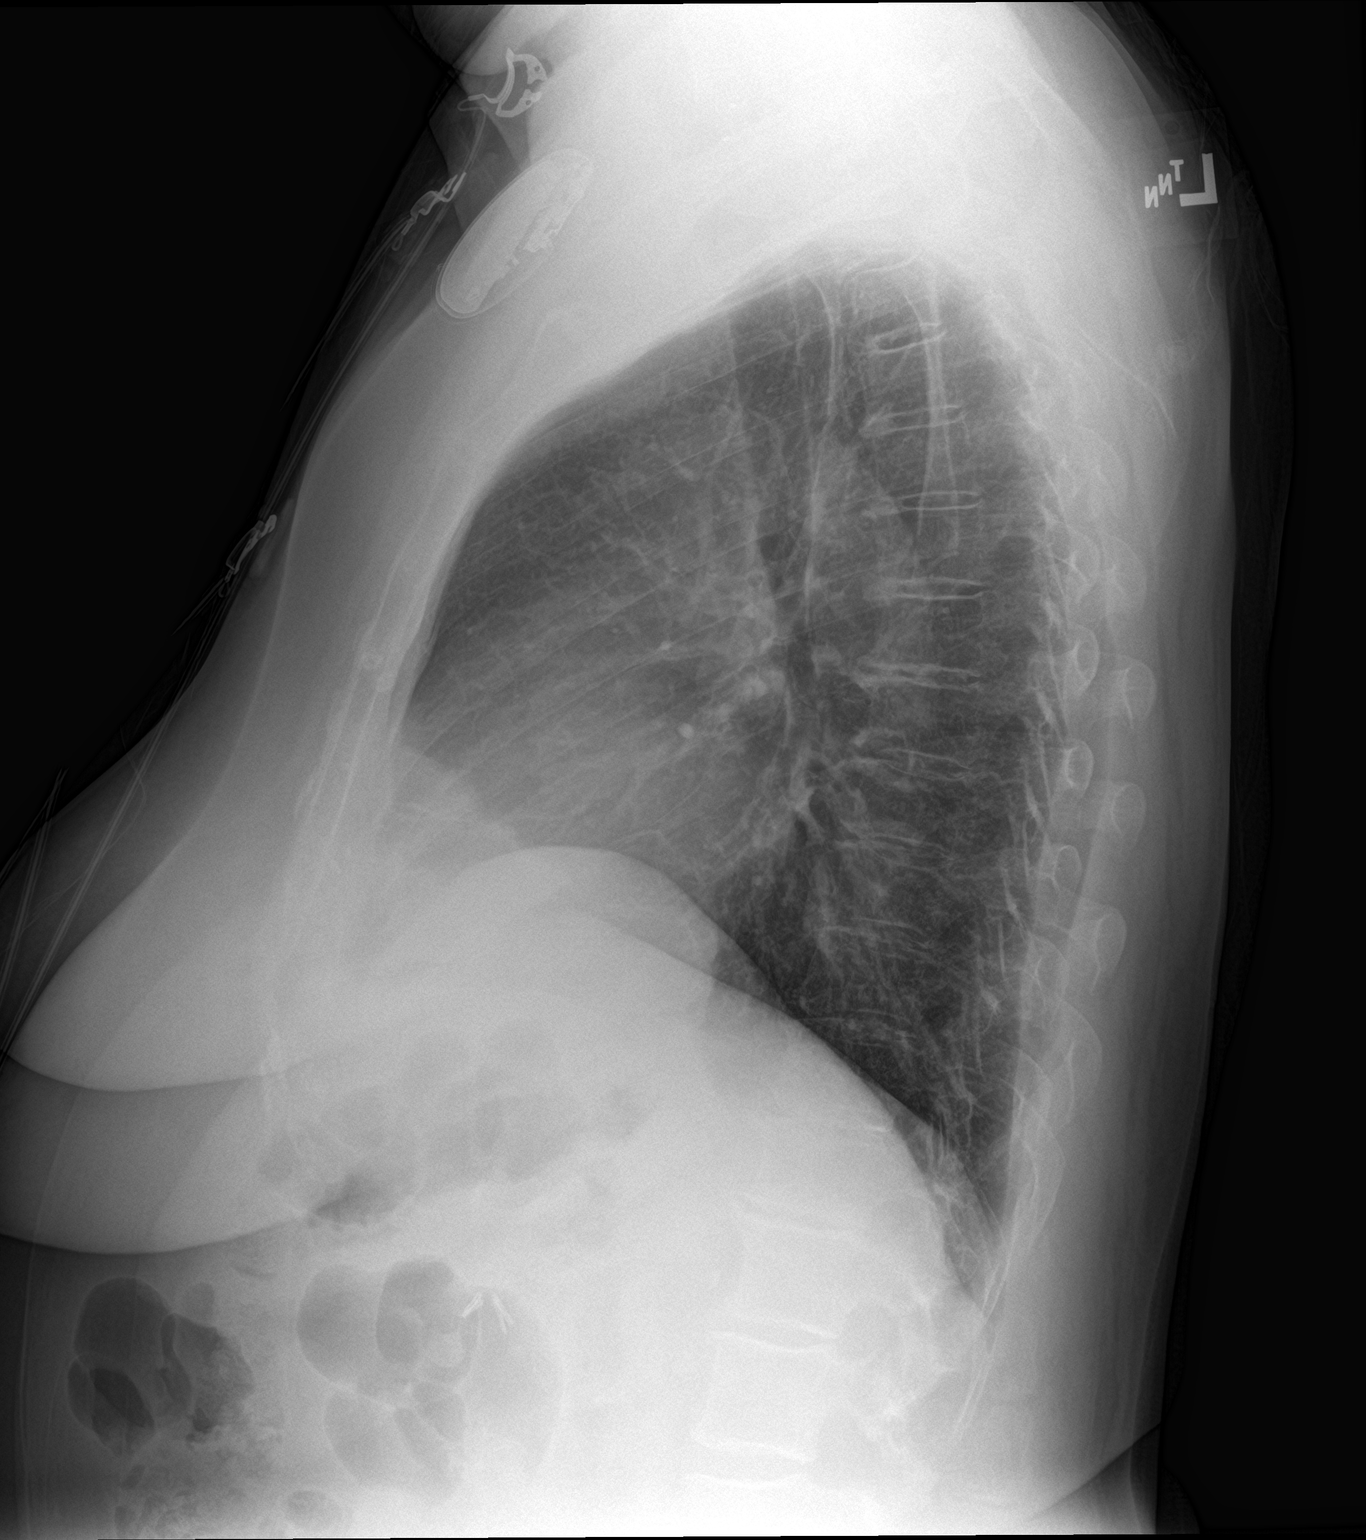

[2 of 2 positions shown; findings below may reference images not displayed]

FINDINGS: Heart size is normal. Mild tortuosity of the aorta. The lungs are
clear. The vascularity is normal. No effusions. No abnormal bone
finding. Left-sided vagal nerve stimulator lead coiled in the left
lower neck.
IMPRESSION: No active cardiopulmonary disease.

## 2018-10-14 NOTE — ED Triage Notes (Signed)
Pt arrived via POV with chest pain for a few months and radiates to her back. Pt has been belching a lot and wanted to come and be checked.

## 2018-10-14 NOTE — Discharge Instructions (Addendum)
Please seek medical attention for any high fevers, chest pain, shortness of breath, change in behavior, persistent vomiting, bloody stool or any other new or concerning symptoms.  

## 2018-10-14 NOTE — ED Notes (Signed)
Pt with complaint of history of chest pain radiating to jaw and back. Pt states when this happens "feels like I swallowed something and it is stuck." Pain resolves on its own, pain is sporadic. Pt. c/o SOB with walking, leg swelling below knees, also sporadic. Pt. States 0/10 pain at present.

## 2018-10-14 NOTE — ED Provider Notes (Signed)
[facility] Emergency Department Provider Note   ____________________________________________   I have reviewed the triage vital signs and the nursing notes.   HISTORY  Chief Complaint Chest Pain   History limited by: Not Limited   HPI Angela Delacruz is a 63 y.o. female who presents to the emergency department today because of concern for chest pain. It is located in the central chest. Some radiation to her back. It has been present for the past couple of months. It is intermittent. No apparent pattern. The patient has not had any associated shortness of breath. The patient states that she was being scheduled for an endoscopy and colonoscopy prior to the [place] outbreak. She has had increased belching. Denies any recent fevers.    Records reviewed. Per medical record review patient has a history of seizures.   Past Medical History:  Diagnosis Date  . Complication of anesthesia   . GERD (gastroesophageal reflux disease)   . History of hiatal hernia   . Hypothyroidism   . PONV (postoperative nausea and vomiting)   . Seizures ([place])    EPILEPSY    There are no active problems to display for this patient.   Past Surgical History:  Procedure Laterality Date  . APPENDECTOMY    . AUGMENTATION MAMMAPLASTY  1996   explanted 2015  . BREAST IMPLANT REMOVAL Bilateral 2015  . CATARACT EXTRACTION W/PHACO Right 06/03/2016   Procedure: CATARACT EXTRACTION PHACO AND INTRAOCULAR LENS PLACEMENT ([place]);  Surgeon: Birder Robson, MD;  Location: [facility] ORS;  Service: Ophthalmology;  Laterality: Right;  LOT PACK: [id number] H US:01:02 AP:52.8 CDE:13.78  . CATARACT EXTRACTION W/PHACO Left 09/02/2016   Procedure: CATARACT EXTRACTION PHACO AND INTRAOCULAR LENS PLACEMENT (IOC);  Surgeon: Birder Robson, MD;  Location: [facility] ORS;  Service: Ophthalmology;  Laterality: Left;  [place] 54.6 AP% 16.8 CDE 9.17 Fluid pack lot # [id number]  . CHOLECYSTECTOMY    . COLON SURGERY     RESECTION   . IMPLANTATION VAGAL NERVE STIMULATOR    . KNEE ARTHROSCOPY    . OOPHORECTOMY    . SALIVARY GLAND SURGERY    . SHOULDER ARTHROSCOPY    . WRIST SURGERY      Prior to Admission medications   Medication Sig Start Date End Date Taking? Authorizing Provider  amitriptyline (ELAVIL) 25 MG tablet Take 25 mg by mouth at bedtime.  05/23/15   [provider]  aspirin EC 81 MG tablet Take 81 mg by mouth daily.    [provider]  busPIRone (BUSPAR) 10 MG tablet Take 10 mg by mouth 2 (two) times daily. 12/09/17   [provider]  calcium carbonate (OSCAL) 1500 (600 Ca) MG TABS tablet Take 600 mg of elemental calcium by mouth 2 (two) times daily with a meal.    [provider]  cholecalciferol (D-VI-SOL) 400 UNIT/ML LIQD Take 400 Units by mouth 2 (two) times daily.    [provider]  levETIRAcetam (KEPPRA) 500 MG tablet Take 500 mg by mouth 2 (two) times daily.  12/14/15 08/28/16  [provider]  levothyroxine (SYNTHROID, LEVOTHROID) 50 MCG tablet Take 50 mcg by mouth daily before breakfast.    [provider]  omeprazole (PRILOSEC) 20 MG capsule Take 20 mg by mouth daily.    [provider]  OVER THE COUNTER MEDICATION Take 2 capsules by mouth daily. Truvision multivitamin    [provider]    Allergies Depakote [divalproex sodium] and Dilantin [phenytoin]  Family History  Problem Relation Age of Onset  .  Breast cancer Neg Hx     Social History Social History   Tobacco Use  . Smoking status: Never Smoker  . Smokeless tobacco: Never Used  Substance Use Topics  . Alcohol use: Yes    Comment: rare  . Drug use: No    Review of Systems Constitutional: No fever/chills Eyes: No visual changes. ENT: No sore throat. Cardiovascular: Positive for chest pain. Respiratory: Denies shortness of breath. Gastrointestinal: No abdominal pain.  Positive for increased belching.  Genitourinary: Negative for  dysuria. Musculoskeletal: Positive for back pain. Skin: Negative for rash. Neurological: Negative for headaches, focal weakness or numbness.  ____________________________________________   PHYSICAL EXAM:  VITAL SIGNS: ED Triage Vitals [10/14/18 1724]  Enc Vitals Group     BP (!) 166/81     Pulse Rate 77     Resp 18     Temp (!) 97.5 F (36.4 C)     Temp Source Oral     SpO2 100 %     Weight 190 lb (86.2 kg)     Height 5\' 6"  (1.676 m)     Head Circumference      Peak Flow      Pain Score 0   Constitutional: Alert and oriented.  Eyes: Conjunctivae are normal.  ENT      Head: Normocephalic and atraumatic.      Nose: No congestion/rhinnorhea.      Mouth/Throat: Mucous membranes are moist.      Neck: No stridor. Hematological/Lymphatic/Immunilogical: No cervical lymphadenopathy. Cardiovascular: Normal rate, regular rhythm.  No murmurs, rubs, or gallops. Respiratory: Normal respiratory effort without tachypnea nor retractions. Breath sounds are clear and equal bilaterally. No wheezes/rales/rhonchi. Gastrointestinal: Soft and non tender. No rebound. No guarding.  Genitourinary: Deferred Musculoskeletal: Normal range of motion in all extremities. No lower extremity edema. Neurologic:  Normal speech and language. No gross focal neurologic deficits are appreciated.  Skin:  Skin is warm, dry and intact. No rash noted. Psychiatric: Mood and affect are normal. Speech and behavior are normal. Patient exhibits appropriate insight and judgment.  ____________________________________________    LABS (pertinent positives/negatives)  Trop <0.03 CBC wbc 8.2, hgb 13.0, plt 288 BMP wnl except cr 1.02  ____________________________________________   EKG  I, Nance Pear, attending physician, personally viewed and interpreted this EKG  EKG Time: 1718 Rate: 78 Rhythm: normal sinus rhythm Axis: left axis deviation Intervals: qtc 435 QRS: narrow ST changes: no st  elevation Impression: abnormal ekg   ____________________________________________    RADIOLOGY  CXR No acute abnormality  ____________________________________________   PROCEDURES  Procedures  ____________________________________________   INITIAL IMPRESSION / ASSESSMENT AND PLAN / ED COURSE  Pertinent labs & imaging results that were available during my care of the patient were reviewed by me and considered in my medical decision making (see chart for details).   Patient presented because of concern for chest pain for the past couple of months. Troponin negative. At this time I doubt acs. Also doubt PE or dissection given history. CXR without pna or ptx. At this time do think likely secondary to gastritis or esophagitis. Discussed this with the patient. Discussed follow up.  ____________________________________________   FINAL CLINICAL IMPRESSION(S) / ED DIAGNOSES  Final diagnoses:  Nonspecific chest pain     Note: This dictation was prepared with Dragon dictation. Any transcriptional errors that result from this process are unintentional     Nance Pear, MD 10/14/18 1850

## 2018-11-29 DIAGNOSIS — R140 Abdominal distension (gaseous): Secondary | ICD-10-CM | POA: Insufficient documentation

## 2018-11-29 DIAGNOSIS — K2100 Gastro-esophageal reflux disease with esophagitis, without bleeding: Secondary | ICD-10-CM | POA: Insufficient documentation

## 2018-11-29 DIAGNOSIS — K2950 Unspecified chronic gastritis without bleeding: Secondary | ICD-10-CM | POA: Insufficient documentation

## 2018-11-30 ENCOUNTER — Other Ambulatory Visit: Payer: Self-pay | Admitting: Student

## 2018-11-30 DIAGNOSIS — R110 Nausea: Secondary | ICD-10-CM

## 2018-11-30 DIAGNOSIS — R1013 Epigastric pain: Secondary | ICD-10-CM

## 2018-11-30 DIAGNOSIS — R140 Abdominal distension (gaseous): Secondary | ICD-10-CM

## 2018-11-30 DIAGNOSIS — K2100 Gastro-esophageal reflux disease with esophagitis, without bleeding: Secondary | ICD-10-CM

## 2018-11-30 DIAGNOSIS — K2950 Unspecified chronic gastritis without bleeding: Secondary | ICD-10-CM

## 2018-11-30 DIAGNOSIS — K5909 Other constipation: Secondary | ICD-10-CM

## 2018-12-09 ENCOUNTER — Other Ambulatory Visit: Payer: Self-pay

## 2018-12-09 ENCOUNTER — Ambulatory Visit
Admission: RE | Admit: 2018-12-09 | Discharge: 2018-12-09 | Disposition: A | Discharge status: Home / Self Care | Payer: PRIVATE HEALTH INSURANCE | Source: Ambulatory Visit | Attending: Student | Admitting: Student

## 2018-12-09 DIAGNOSIS — K2950 Unspecified chronic gastritis without bleeding: Secondary | ICD-10-CM | POA: Diagnosis present

## 2018-12-09 DIAGNOSIS — R140 Abdominal distension (gaseous): Secondary | ICD-10-CM | POA: Diagnosis present

## 2018-12-09 DIAGNOSIS — K2100 Gastro-esophageal reflux disease with esophagitis, without bleeding: Secondary | ICD-10-CM

## 2018-12-09 DIAGNOSIS — K5909 Other constipation: Secondary | ICD-10-CM | POA: Diagnosis present

## 2018-12-09 DIAGNOSIS — R1013 Epigastric pain: Secondary | ICD-10-CM | POA: Diagnosis present

## 2018-12-09 DIAGNOSIS — R110 Nausea: Secondary | ICD-10-CM | POA: Diagnosis present

## 2018-12-09 LAB — POCT I-STAT CREATININE: Creatinine, Ser: 1 mg/dL (ref 0.44–1.00)

## 2018-12-09 IMAGING — CT CT ABDOMEN AND PELVIS WITH CONTRAST
2 of 5 series · 15 of 46 positions shown, 17 images · IV contrast (omnipaque)
Comparison: None.

CLINICAL DATA: Upper abdominal and back region pain.

EXAM:
CT ABDOMEN AND PELVIS WITH CONTRAST
TECHNIQUE: Multidetector CT imaging of the abdomen and pelvis was performed
using the standard protocol following bolus administration of
intravenous contrast.
CONTRAST:  100mL OMNIPAQUE IOHEXOL 300 MG/ML  SOLN

[Series 2: routine abd/pel with · axial · 0.71mm/px · z∈[-180,+245]mm · 12 of 95 slices shown, 14 images]
[im 5/95  soft-tissue]
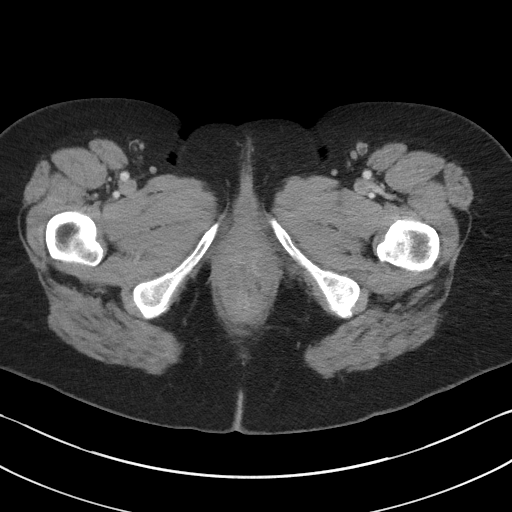
[im 5/95  bone]
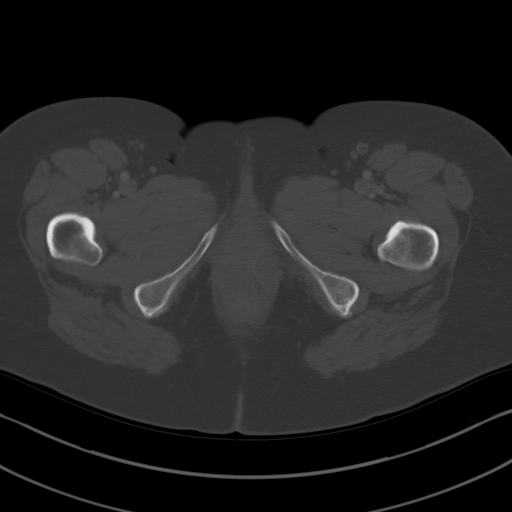
[im 15/95  soft-tissue]
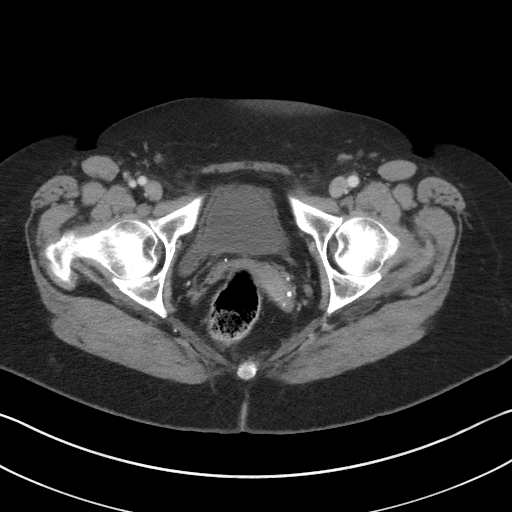
[im 20/95  soft-tissue]
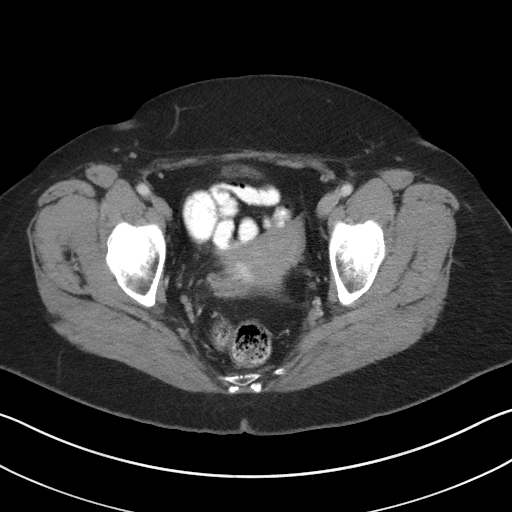
[im 30/95  soft-tissue]
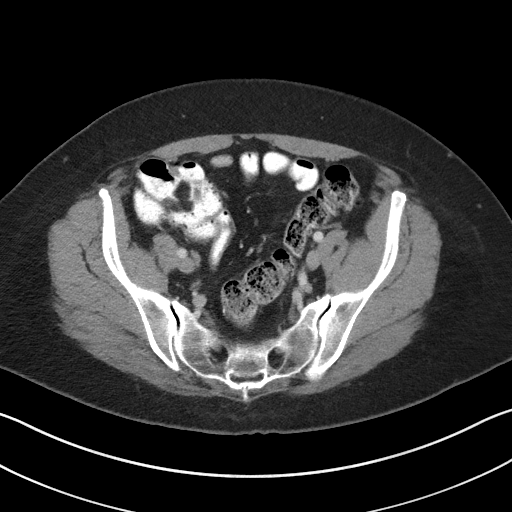
[im 35/95  soft-tissue]
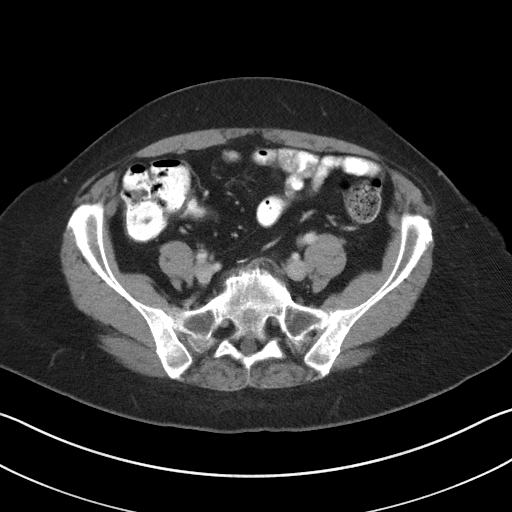
[im 45/95  soft-tissue]
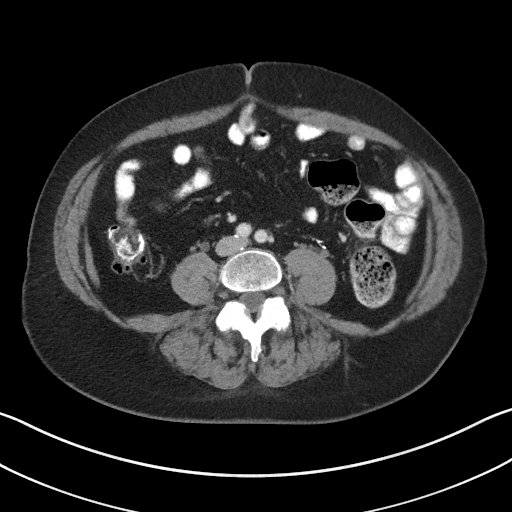
[im 50/95  soft-tissue]
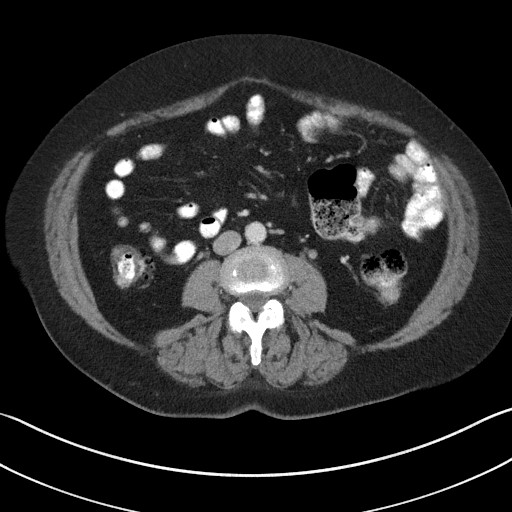
[im 60/95  soft-tissue]
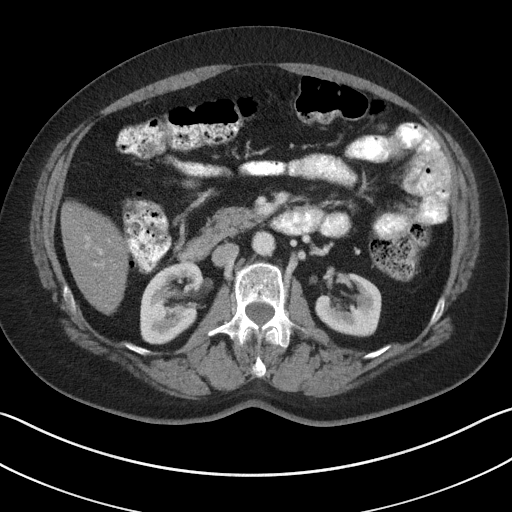
[im 65/95  soft-tissue]
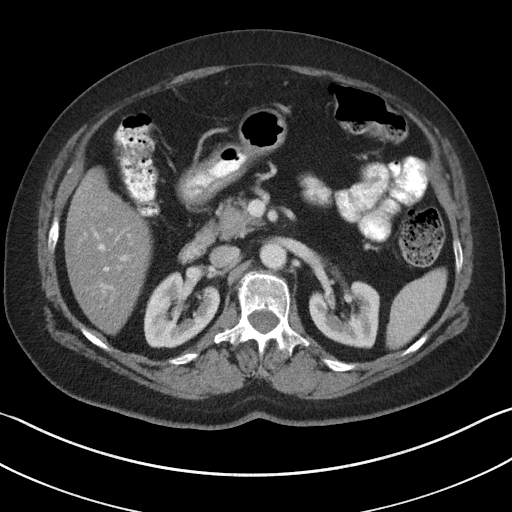
[im 65/95  bone]
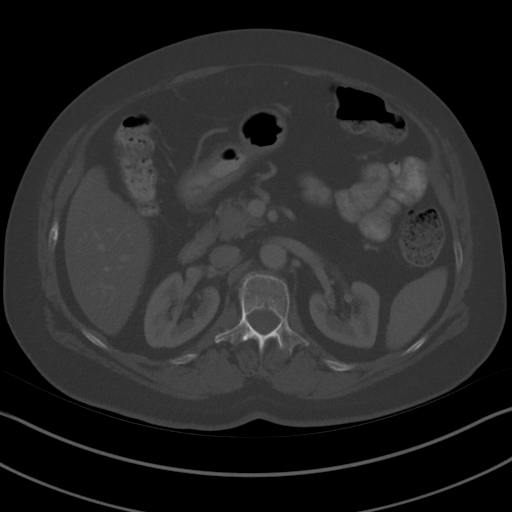
[im 75/95  soft-tissue]
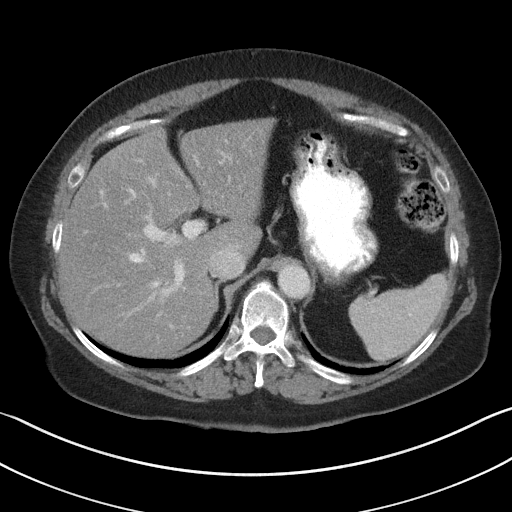
[im 80/95  soft-tissue]
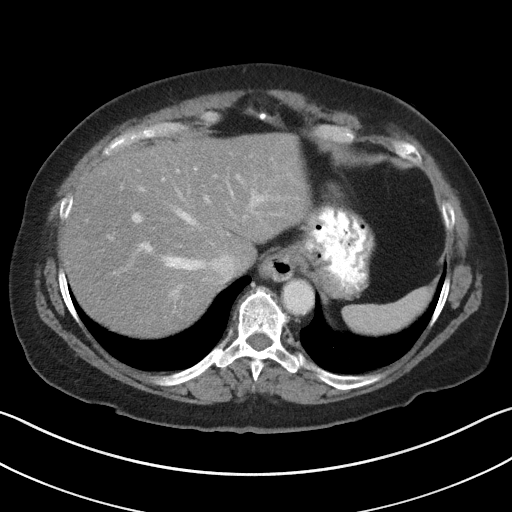
[im 90/95  soft-tissue]
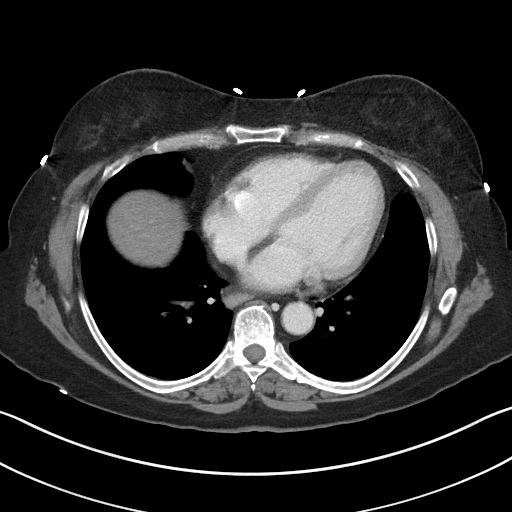

[Series 5: coronal st · coronal · 0.72mm/px · 3 of 99 slices shown]
[im 33/99  soft-tissue]
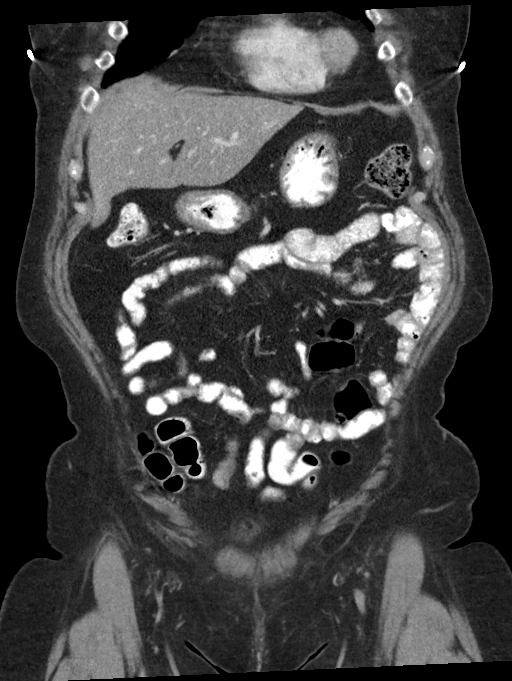
[im 44/99  soft-tissue]
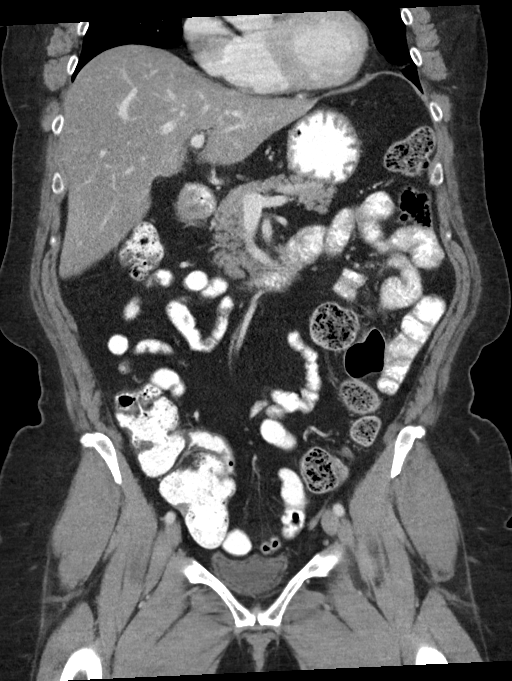
[im 55/99  soft-tissue]
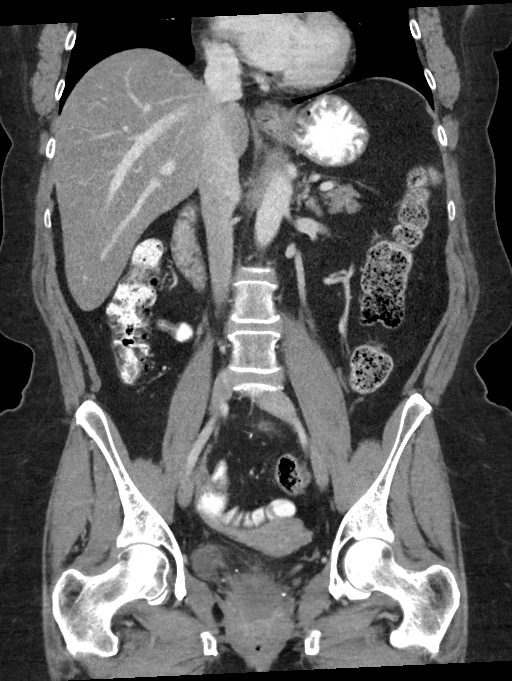

[15 of 46 positions shown; findings below may reference images not displayed]

FINDINGS: Lower chest: Lung bases are clear.  There is a small hiatal hernia.

Hepatobiliary: There is hepatic steatosis. No focal liver lesions
are demonstrable. The gallbladder is absent. There is no appreciable
biliary duct dilatation.

Pancreas: There is no pancreatic mass or inflammatory focus.

Spleen: No splenic lesions are evident.

Adrenals/Urinary Tract: Adrenals bilaterally appear unremarkable.
There is no appreciable renal mass or hydronephrosis on either side.
There is no evident renal or ureteral calculus on either side.
Urinary bladder is midline with wall thickness within normal limits.

Stomach/Bowel: There is moderate stool throughout the colon. There
is apparent thickening of the wall of the distal body of the stomach
and gastric antrum. Wall thickening extends into the proximal
duodenum. No other bowel wall thickening is evident on this study.
There is no evident bowel obstruction. The terminal ileum appears
normal. There is lipomatous infiltration of the ileocecal valve. No
evident free air or portal venous air.

Vascular/Lymphatic: There is aortic atherosclerosis. There is no
abdominal aortic aneurysm. There is no adenopathy appreciable in the
abdomen or pelvis.

Reproductive: Uterus is anteverted.  No evident pelvic mass.

Other: The appendix is absent. There is no periappendiceal region
inflammatory change. There is no abscess or ascites in the abdomen
or pelvis.

Musculoskeletal: There is degenerative change in the lower thoracic
and lumbar regions. There are no blastic or lytic bone lesions.
There is no intramuscular or abdominal wall lesion.
IMPRESSION: 1. Wall thickening in the distal stomach and proximal duodenum
consistent with a degree of gastritis and proximal duodenitis. No
ulcerations in these areas seen by CT.

2. No evident bowel obstruction. No abscess in the abdomen or
pelvis. Appendix absent. No periappendiceal region inflammatory
change.

3.  Small hiatal hernia.

4.  Hepatic steatosis.  Gallbladder absent.

5. No evident renal or ureteral calculus. No hydronephrosis. Urinary
bladder wall thickness is within normal limits.

## 2018-12-09 MED ORDER — IOHEXOL 300 MG/ML  SOLN
100.0000 mL | Freq: Once | INTRAMUSCULAR | Status: AC | PRN
  Administered 2018-12-09: 100 mL via INTRAVENOUS

## 2019-03-23 ENCOUNTER — Other Ambulatory Visit: Payer: Self-pay | Admitting: Obstetrics & Gynecology

## 2019-03-23 DIAGNOSIS — Z1231 Encounter for screening mammogram for malignant neoplasm of breast: Secondary | ICD-10-CM

## 2019-04-21 ENCOUNTER — Other Ambulatory Visit: Payer: Self-pay | Admitting: Student

## 2019-04-21 DIAGNOSIS — R140 Abdominal distension (gaseous): Secondary | ICD-10-CM

## 2019-04-21 DIAGNOSIS — R1013 Epigastric pain: Secondary | ICD-10-CM

## 2019-06-22 ENCOUNTER — Ambulatory Visit
Admission: RE | Admit: 2019-06-22 | Discharge: 2019-06-22 | Disposition: A | Discharge status: Home / Self Care | Payer: PRIVATE HEALTH INSURANCE | Source: Ambulatory Visit | Attending: Obstetrics & Gynecology | Admitting: Obstetrics & Gynecology

## 2019-06-22 DIAGNOSIS — Z1231 Encounter for screening mammogram for malignant neoplasm of breast: Secondary | ICD-10-CM | POA: Insufficient documentation

## 2019-06-22 IMAGING — MG DIGITAL SCREENING BILAT W/ TOMO W/ CAD
8 series · 8 of 24 positions shown · non-contrast
Comparison: Previous exam(s).

CLINICAL DATA: Screening.

EXAM:
DIGITAL SCREENING BILATERAL MAMMOGRAM WITH TOMO AND CAD

[R MLO synth-2D]
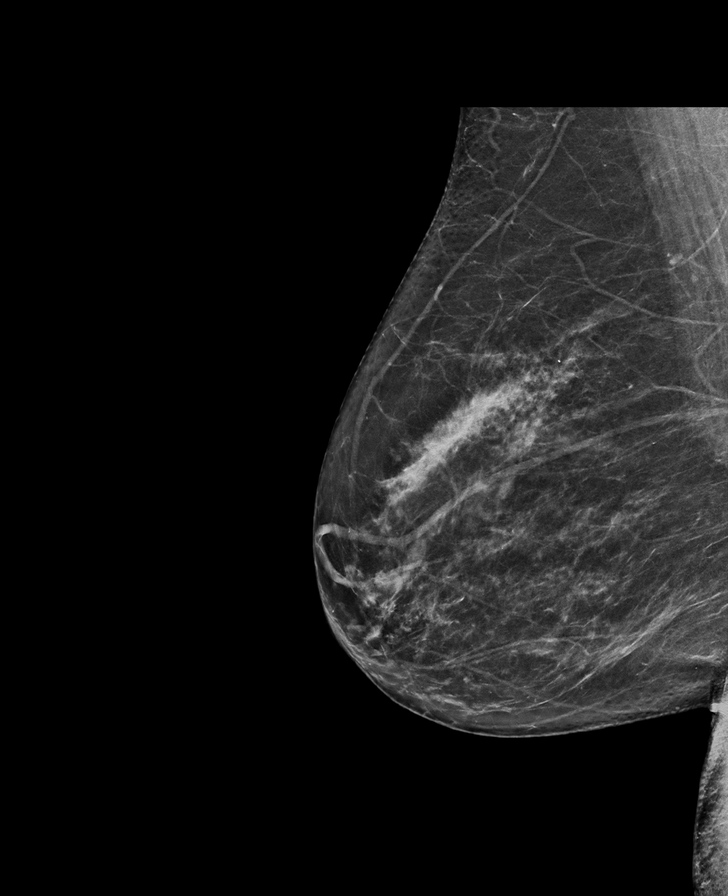

[R CC synth-2D]
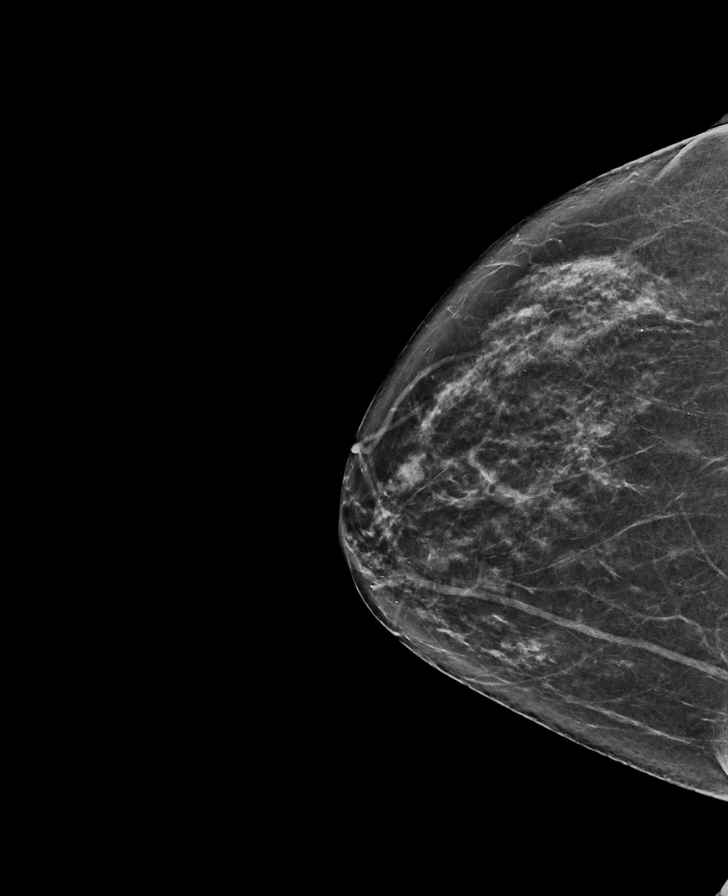

[L MLO synth-2D]
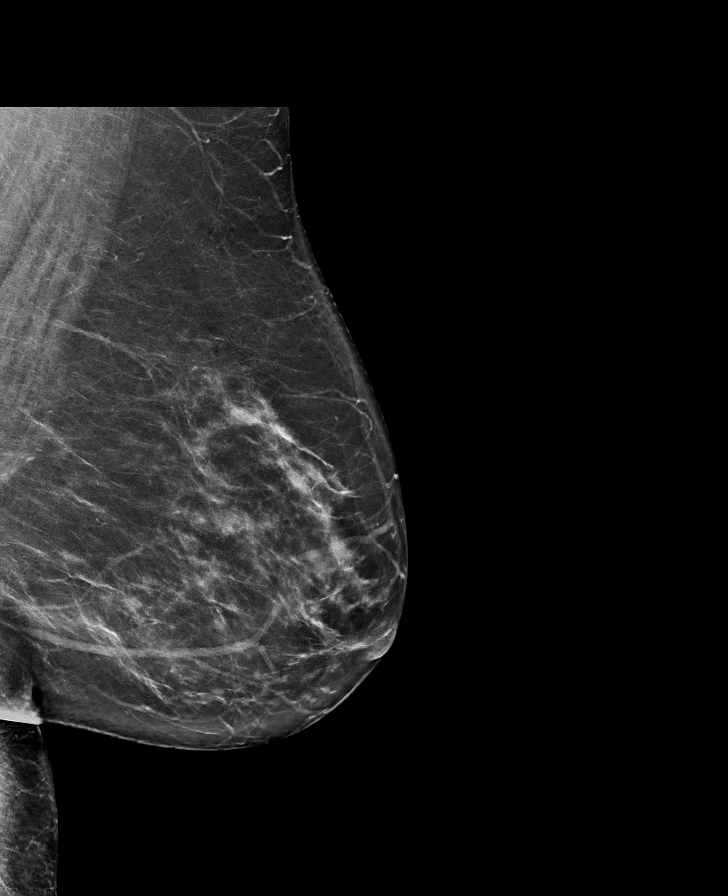

[L CC synth-2D]
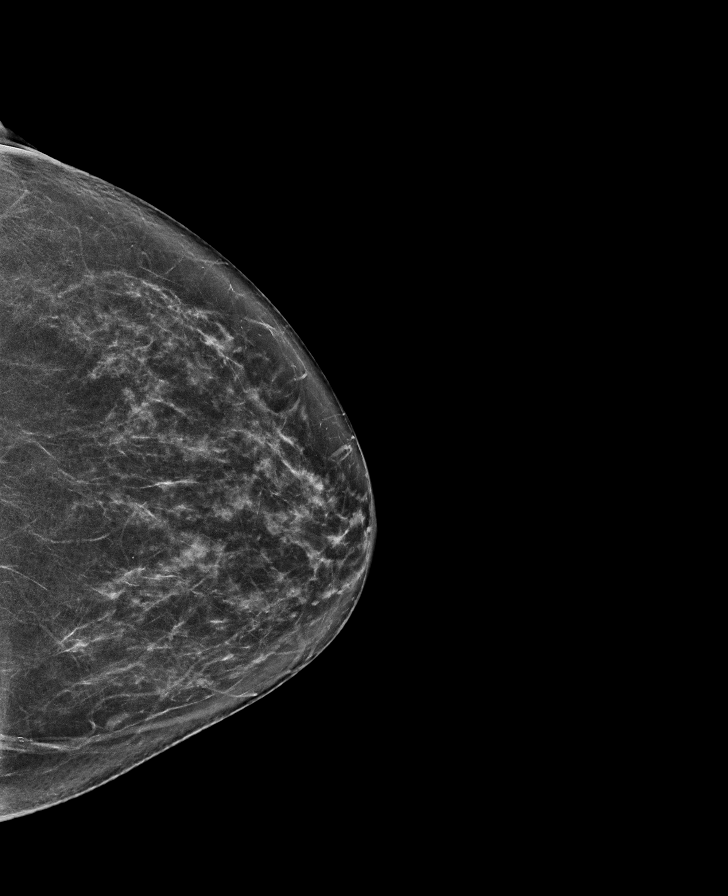

[L CC tomo · tomo slice 37/73.0]
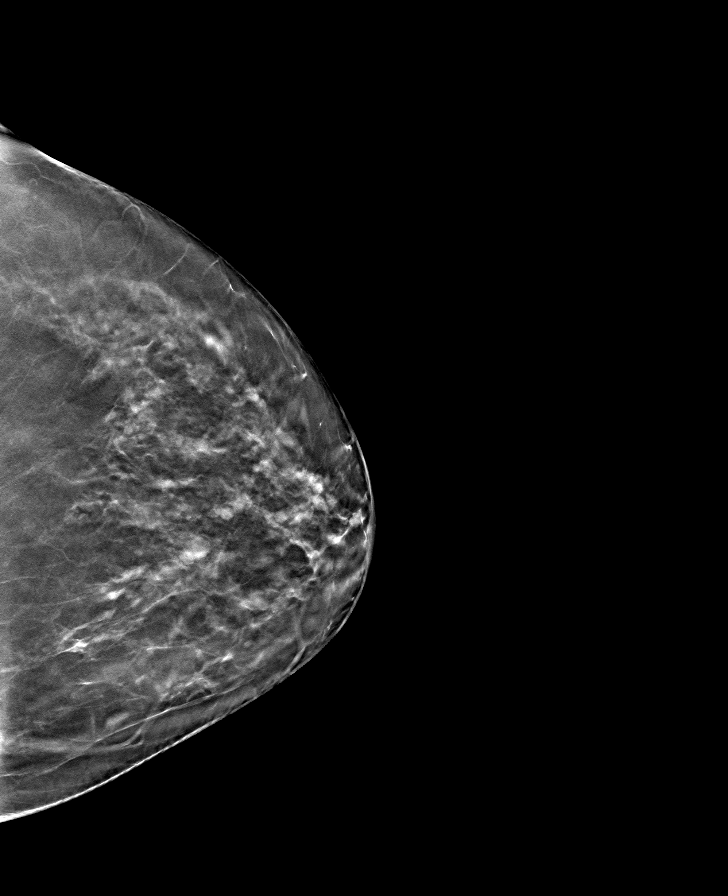

[R CC tomo · tomo slice 35/68.0]
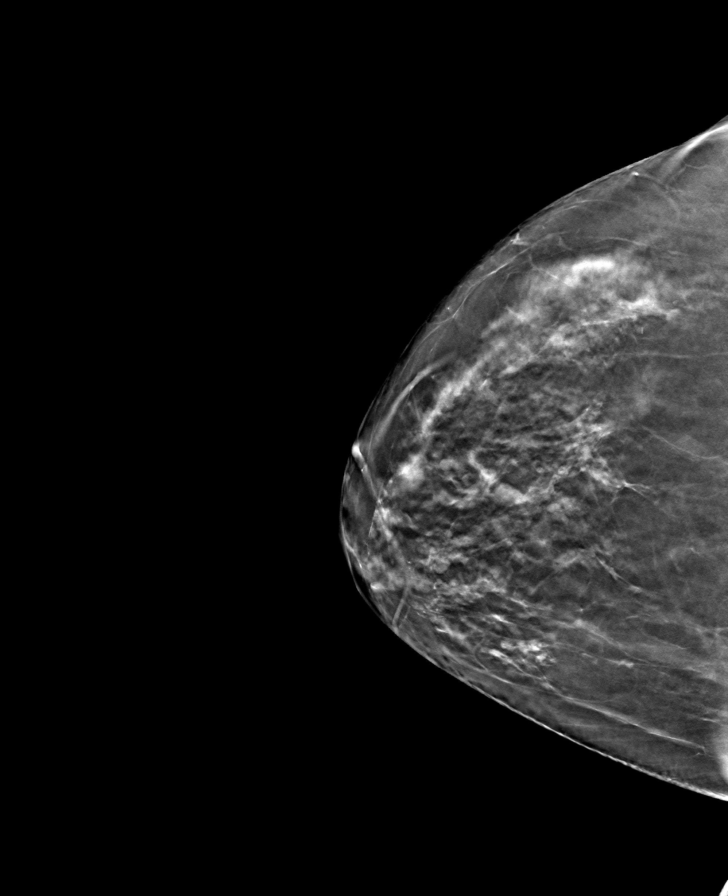

[L MLO tomo · tomo slice 41/81.0]
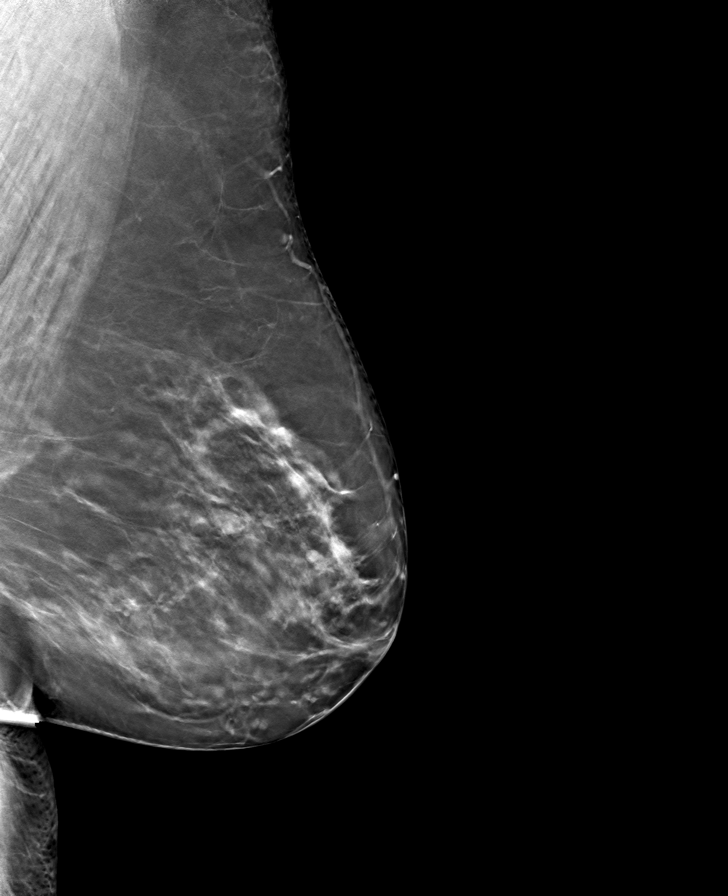

[R MLO tomo · tomo slice 35/70.0]
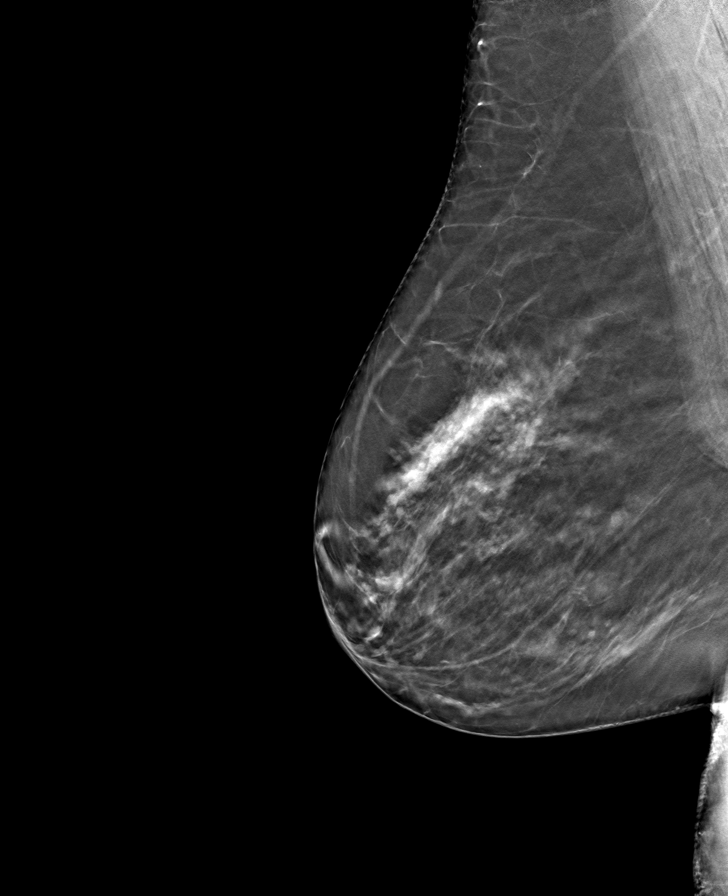

[8 of 24 positions shown; findings below may reference images not displayed]

ACR Breast Density Category c: The breast tissue is heterogeneously
dense, which may obscure small masses.
FINDINGS: There are no findings suspicious for malignancy. Images were
processed with CAD.
IMPRESSION: No mammographic evidence of malignancy. A result letter of this
screening mammogram will be mailed directly to the patient.

RECOMMENDATION:
Screening mammogram in one year. (Code:[5V])

BI-RADS CATEGORY  1: Negative.

## 2019-11-17 ENCOUNTER — Other Ambulatory Visit: Payer: Self-pay

## 2019-11-17 ENCOUNTER — Inpatient Hospital Stay: Payer: PRIVATE HEALTH INSURANCE | Attending: Oncology | Admitting: Oncology

## 2019-11-17 ENCOUNTER — Telehealth: Payer: Self-pay

## 2019-11-17 ENCOUNTER — Encounter: Payer: Self-pay | Admitting: Oncology

## 2019-11-17 ENCOUNTER — Inpatient Hospital Stay: Payer: PRIVATE HEALTH INSURANCE

## 2019-11-17 VITALS — BP 135/82 | HR 79 | Temp 96.8°F | Resp 18 | Ht 66.0 in | Wt 183.0 lb

## 2019-11-17 DIAGNOSIS — E611 Iron deficiency: Secondary | ICD-10-CM | POA: Diagnosis not present

## 2019-11-17 DIAGNOSIS — Z801 Family history of malignant neoplasm of trachea, bronchus and lung: Secondary | ICD-10-CM

## 2019-11-17 DIAGNOSIS — Z800 Family history of malignant neoplasm of digestive organs: Secondary | ICD-10-CM

## 2019-11-17 DIAGNOSIS — Z87891 Personal history of nicotine dependence: Secondary | ICD-10-CM | POA: Diagnosis not present

## 2019-11-17 HISTORY — DX: Iron deficiency: E61.1

## 2019-11-17 LAB — CBC WITH DIFFERENTIAL/PLATELET
Abs Immature Granulocytes: 0.02 10*3/uL (ref 0.00–0.07)
Basophils Absolute: 0.1 10*3/uL (ref 0.0–0.1)
Basophils Relative: 1 %
Eosinophils Absolute: 0.1 10*3/uL (ref 0.0–0.5)
Eosinophils Relative: 1 %
HCT: 40.5 % (ref 36.0–46.0)
Hemoglobin: 13.3 g/dL (ref 12.0–15.0)
Immature Granulocytes: 0 %
Lymphocytes Relative: 23 %
Lymphs Abs: 1.8 10*3/uL (ref 0.7–4.0)
MCH: 27.3 pg (ref 26.0–34.0)
MCHC: 32.8 g/dL (ref 30.0–36.0)
MCV: 83.2 fL (ref 80.0–100.0)
Monocytes Absolute: 0.7 10*3/uL (ref 0.1–1.0)
Monocytes Relative: 9 %
Neutro Abs: 5.1 10*3/uL (ref 1.7–7.7)
Neutrophils Relative %: 66 %
Platelets: 283 10*3/uL (ref 150–400)
RBC: 4.87 MIL/uL (ref 3.87–5.11)
RDW: 17.4 % — ABNORMAL HIGH (ref 11.5–15.5)
WBC: 7.8 10*3/uL (ref 4.0–10.5)
nRBC: 0 % (ref 0.0–0.2)

## 2019-11-17 LAB — IRON AND TIBC
Iron: 43 ug/dL (ref 28–170)
Saturation Ratios: 9 % — ABNORMAL LOW (ref 10.4–31.8)
TIBC: 487 ug/dL — ABNORMAL HIGH (ref 250–450)
UIBC: 444 ug/dL

## 2019-11-17 LAB — FERRITIN: Ferritin: 7 ng/mL — ABNORMAL LOW (ref 11–307)

## 2019-11-17 LAB — URINALYSIS, COMPLETE (UACMP) WITH MICROSCOPIC
Bilirubin Urine: NEGATIVE
Glucose, UA: NEGATIVE mg/dL
Ketones, ur: NEGATIVE mg/dL
Nitrite: NEGATIVE
Protein, ur: NEGATIVE mg/dL
Specific Gravity, Urine: 1.014 (ref 1.005–1.030)
pH: 5 (ref 5.0–8.0)

## 2019-11-17 NOTE — Progress Notes (Signed)
Hematology/Oncology Consult note [facility] Telephone:([phone] [phone]   Patient Care Team: Derinda Late, MD as PCP - General (Family Medicine)  REFERRING PROVIDER: [facility]  CHIEF COMPLAINTS/REASON FOR VISIT:  Evaluation of iron deficiency  HISTORY OF PRESENTING ILLNESS:   Angela Delacruz is a  64 y.o.  female with PMH listed below was seen in consultation at the request of  [facility]  for evaluation of iron deficiency  10/10/2019, WBC 7, hemoglobin 12.8, hematocrit 41, MCV 81, platelet 3 20,000.  BUN 13, creatinine 1.09, EGFR 55, Bilirubin less than 0.2, calcium 9.8, normal AST, ALT, alkaline phosphatase.  Free T4 1.09.  Vitamin D 25 hydroxy 57 Ferritin 8, Patient denies any bloody or black stool.  She is postmenopausal.   She has a history of blood donation.  Last blood donation was 4 months ago. History of IBS, esophagitis, gastritis diverticulosis, Patient follows up with [facility] gastroenterology  Review of Systems  Constitutional: Negative for appetite change, chills, fatigue and fever.  HENT:   Negative for hearing loss and voice change.   Eyes: Negative for eye problems.  Respiratory: Negative for chest tightness and cough.   Cardiovascular: Negative for chest pain.  Gastrointestinal: Negative for abdominal distention, abdominal pain and blood in stool.  Endocrine: Negative for hot flashes.  Genitourinary: Negative for difficulty urinating and frequency.   Musculoskeletal: Negative for arthralgias.  Skin: Negative for itching and rash.  Neurological: Negative for extremity weakness.  Hematological: Negative for adenopathy.  Psychiatric/Behavioral: Negative for confusion.    MEDICAL HISTORY:  Past Medical History:  Diagnosis Date   Complication of anesthesia    GERD (gastroesophageal reflux disease)    History of hiatal hernia    Hypothyroidism    PONV (postoperative nausea and vomiting)     Seizures ([place])    EPILEPSY    SURGICAL HISTORY: Past Surgical History:  Procedure Laterality Date   APPENDECTOMY     AUGMENTATION MAMMAPLASTY  1996   explanted 2015   BREAST IMPLANT REMOVAL Bilateral 2015   CATARACT EXTRACTION W/PHACO Right 06/03/2016   Procedure: CATARACT EXTRACTION PHACO AND INTRAOCULAR LENS PLACEMENT ([place]);  Surgeon: Birder Robson, MD;  Location: [facility] ORS;  Service: Ophthalmology;  Laterality: Right;  LOT PACK: [id number] H US:01:02 AP:52.8 CDE:13.78   CATARACT EXTRACTION W/PHACO Left 09/02/2016   Procedure: CATARACT EXTRACTION PHACO AND INTRAOCULAR LENS PLACEMENT ([facility]);  Surgeon: Birder Robson, MD;  Location: [facility] ORS;  Service: Ophthalmology;  Laterality: Left;  [place] 54.6 AP% 16.8 CDE 9.17 Fluid pack lot # [id number]   CHOLECYSTECTOMY     COLON SURGERY     RESECTION   IMPLANTATION VAGAL NERVE STIMULATOR     KNEE ARTHROSCOPY     OOPHORECTOMY     SALIVARY GLAND SURGERY     SHOULDER ARTHROSCOPY     WRIST SURGERY      SOCIAL HISTORY: Social History   Socioeconomic History   Marital status: Married    Spouse name: Not on file   Number of children: Not on file   Years of education: Not on file   Highest education level: Not on file  Occupational History   Not on file  Tobacco Use   Smoking status: Former Smoker    Packs/day: 1.00    Years: 20.00    Pack years: 20.00    Quit date: 11/17/1994    Years since quitting: 25.0   Smokeless tobacco: Never Used  Substance and Sexual Activity   Alcohol use: Yes  Comment: rare   Drug use: No   Sexual activity: Not on file  Other Topics Concern   Not on file  Social History Narrative   Not on file   Social Determinants of Health   Financial Resource Strain:    Difficulty of Paying Living Expenses:   Food Insecurity:    Worried About Running Out of Food in the Last Year:    Arboriculturist in the Last Year:   Transportation Needs:    Film/video editor  (Medical):    Lack of Transportation (Non-Medical):   Physical Activity:    Days of Exercise per Week:    Minutes of Exercise per Session:   Stress:    Feeling of Stress :   Social Connections:    Frequency of Communication with Friends and Family:    Frequency of Social Gatherings with Friends and Family:    Attends Religious Services:    Active Member of Clubs or Organizations:    Attends Music therapist:    Marital Status:   Intimate Partner Violence:    Fear of Current or Ex-Partner:    Emotionally Abused:    Physically Abused:    Sexually Abused:     FAMILY HISTORY: Family History  Problem Relation Age of Onset   Congenital heart disease Mother    Pancreatic cancer Father    Cancer Maternal Grandmother    Lung cancer Maternal Grandfather    Breast cancer Neg Hx     ALLERGIES:  is allergic to depakote [divalproex sodium] and dilantin [phenytoin].  MEDICATIONS:  Current Outpatient Medications  Medication Sig Dispense Refill   cholecalciferol (D-VI-SOL) 400 UNIT/ML LIQD Take 400 Units by mouth 2 (two) times daily.     estradiol (VIVELLE-DOT) 0.025 MG/24HR APPLY 1 PATCH TOPICALLY TWICE A WEEK     lamoTRIgine (LAMICTAL) 100 MG tablet TAKE ONE HALF TABLET BY MOUTH IN THE AM AND TAKE 1 TABLET BY MOUTH IN THE PM FOR 2 WEEKS. THEN TAKE 1 TAB TWICE DAILY FOR 2 WEEKS. TAKE TAKE     lamoTRIgine (LAMICTAL) 25 MG tablet TAKE 1 TABLET BY MOUTH IN THE MORNING FOR 14 DAYS THEN 1 TABLET TWICE DAILY FOR 14 DAYS THEN 2 TABLET IN THE MORNING AND 1 TABLET NIGHTLY FO     levETIRAcetam (KEPPRA) 750 MG tablet Take 750 mg by mouth 2 (two) times daily.     NP THYROID 30 MG tablet Take 30 mg by mouth daily.     amitriptyline (ELAVIL) 25 MG tablet Take 25 mg by mouth at bedtime.      aspirin EC 81 MG tablet Take 81 mg by mouth daily.     busPIRone (BUSPAR) 10 MG tablet Take 10 mg by mouth 2 (two) times daily.  3   calcium carbonate (OSCAL) 1500 (600 Ca)  MG TABS tablet Take 600 mg of elemental calcium by mouth 2 (two) times daily with a meal.     levothyroxine (SYNTHROID, LEVOTHROID) 50 MCG tablet Take 50 mcg by mouth daily before breakfast.     omeprazole (PRILOSEC) 40 MG capsule Take 40 mg by mouth 2 (two) times daily.     OVER THE COUNTER MEDICATION Take 2 capsules by mouth daily. Truvision multivitamin     No current facility-administered medications for this visit.     PHYSICAL EXAMINATION: ECOG PERFORMANCE STATUS: 0 - Asymptomatic Vitals:   11/17/19 1120  BP: 135/82  Pulse: 79  Resp: 18  Temp: (!) 96.8 F (36 C)  Filed Weights   11/17/19 1120  Weight: 183 lb (83 kg)    Physical Exam Constitutional:      General: She is not in acute distress. HENT:     Head: Normocephalic and atraumatic.  Eyes:     General: No scleral icterus. Cardiovascular:     Rate and Rhythm: Normal rate and regular rhythm.     Heart sounds: Normal heart sounds.  Pulmonary:     Effort: Pulmonary effort is normal. No respiratory distress.     Breath sounds: No wheezing.  Abdominal:     General: Bowel sounds are normal. There is no distension.     Palpations: Abdomen is soft.  Musculoskeletal:        General: No deformity. Normal range of motion.     Cervical back: Normal range of motion and neck supple.  Skin:    General: Skin is warm and dry.     Findings: No erythema or rash.  Neurological:     Mental Status: She is alert and oriented to person, place, and time. Mental status is at baseline.     Cranial Nerves: No cranial nerve deficit.     Coordination: Coordination normal.  Psychiatric:        Mood and Affect: Mood normal.     LABORATORY DATA:  I have reviewed the data as listed Lab Results  Component Value Date   WBC 8.2 10/14/2018   HGB 13.0 10/14/2018   HCT 39.4 10/14/2018   MCV 91.4 10/14/2018   PLT 288 10/14/2018   Recent Labs    12/09/18 0920  CREATININE 1.00   Iron/TIBC/Ferritin/ %Sat No results found for:  IRON, TIBC, FERRITIN, IRONPCTSAT    RADIOGRAPHIC STUDIES: I have personally reviewed the radiological images as listed and agreed with the findings in the report. No results found. 12/09/2018 CT abdomen pelvis with contrast Wall thickening in the distal stomach and proximal duodenum consistent with a degree of gastritis and proximal duodenitis Hepatic steatosis.  Gallbladder absent.  Small hiatal hernia.   ASSESSMENT & PLAN:  1. Iron deficiency   2. Iron deficiency anemia, unspecified iron deficiency anemia type   I reviewed the labs from patient's primary care provider's office. Patient does not have anemia.  Ferritin is decreased at 8.  Etiology of iron deficiency is unknown, questionable due to previous history of blood donation, chronic blood loss from her history of gastritis/esophagitis/duodenitis. Discussed with patient about options of oral iron supplementation versus IV iron infusions. Oral iron supplementation may further exacerbate her chronic gastroenterology symptoms. I think IV Venofer treatments are reasonable. Repeat CBC, iron, TIBC, ferritin. Plan IV iron with Venofer 283m weekly x 2 doses. Allergy reactions/infusion reaction including anaphylactic reaction discussed with patient. Other side effects include but not limited to high blood pressure, skin rash, weight gain, leg swelling, etc. Patient voices understanding and willing to proceed.  Orders Placed This Encounter  Procedures   CBC with Differential/Platelet    Standing Status:   Future    Number of Occurrences:   1    Standing Expiration Date:   11/16/2020   Iron and TIBC    Standing Status:   Future    Number of Occurrences:   1    Standing Expiration Date:   11/16/2020   Ferritin    Standing Status:   Future    Number of Occurrences:   1    Standing Expiration Date:   05/18/2020   Urinalysis, Complete w Microscopic    Standing  Status:   Future    Number of Occurrences:   1    Standing Expiration Date:    11/16/2020    All questions were answered. The patient knows to call the clinic with any problems questions or concerns.  cc [facility]    Return of visit: 4 months Thank you for this kind referral and the opportunity to participate in the care of this patient. A copy of today's note is routed to referring provider    Earlie Server, MD, PhD Hematology Oncology [facility] at [facility] Pager- [phone] 11/17/2019

## 2019-11-17 NOTE — Telephone Encounter (Signed)
Yes, Venofer will be new since pt is new. Thanks

## 2019-11-17 NOTE — Telephone Encounter (Signed)
-----   Message from Earlie Server, MD sent at 11/17/2019  3:38 PM EDT ----- Please arrange patient to have IV Venofer treatments weekly x2. Patient follow-up with me in 4 months.  Lab prior, MD visit 1 to 2 days later +/- Venofer

## 2019-11-17 NOTE — Telephone Encounter (Signed)
Done....  Pt appts has been sched as requested. Venofer *NEW* ? Weekly x2 And 4 months.  Lab prior, MD visit 1 to 2 days later +/- Venofer. Pt is aware of her upcoming appt dates and times.

## 2019-11-17 NOTE — Progress Notes (Signed)
Pt here to establish care. Reports feeling tired most of the time.

## 2019-11-22 ENCOUNTER — Telehealth: Payer: Self-pay | Admitting: *Deleted

## 2019-11-22 NOTE — Telephone Encounter (Signed)
Per [facility] staff message to move Venofer appts out until after June 18th Due to pending authorization  I was unable to reach pt by phone. A detailed message was left on her vmail making her aware that her both Venofer appts has to be moved out until after 6/18 due to pending authorization. 1 dose New Venofer sched on 6/22  2nd dose sched. on 6/29.  A new scheduled appt letter will be mailed out.

## 2019-11-25 ENCOUNTER — Inpatient Hospital Stay: Payer: PRIVATE HEALTH INSURANCE

## 2019-12-02 ENCOUNTER — Ambulatory Visit: Payer: PRIVATE HEALTH INSURANCE

## 2019-12-06 ENCOUNTER — Ambulatory Visit: Payer: PRIVATE HEALTH INSURANCE

## 2019-12-07 ENCOUNTER — Inpatient Hospital Stay: Payer: PRIVATE HEALTH INSURANCE

## 2019-12-07 ENCOUNTER — Other Ambulatory Visit: Payer: Self-pay

## 2019-12-07 VITALS — BP 122/75 | HR 73 | Temp 96.4°F | Resp 18

## 2019-12-07 DIAGNOSIS — E611 Iron deficiency: Secondary | ICD-10-CM | POA: Diagnosis not present

## 2019-12-07 MED ORDER — IRON SUCROSE 20 MG/ML IV SOLN
200.0000 mg | Freq: Once | INTRAVENOUS | Status: AC
  Administered 2019-12-07: 200 mg via INTRAVENOUS
  Filled 2019-12-07: qty 10

## 2019-12-07 MED ORDER — SODIUM CHLORIDE 0.9 % IV SOLN
Freq: Once | INTRAVENOUS | Status: AC
  Filled 2019-12-07: qty 250

## 2019-12-07 MED ORDER — SODIUM CHLORIDE 0.9 % IV SOLN: 200.0000 mg | Freq: Once | INTRAVENOUS | Status: DC

## 2019-12-07 NOTE — Progress Notes (Signed)
Pt tolerated infusion well. Pt and VS stable at discharge.  

## 2019-12-13 ENCOUNTER — Inpatient Hospital Stay: Payer: PRIVATE HEALTH INSURANCE

## 2019-12-13 ENCOUNTER — Other Ambulatory Visit: Payer: Self-pay

## 2019-12-13 VITALS — BP 141/69 | HR 73 | Temp 98.0°F | Resp 16

## 2019-12-13 DIAGNOSIS — E611 Iron deficiency: Secondary | ICD-10-CM | POA: Diagnosis not present

## 2019-12-13 MED ORDER — SODIUM CHLORIDE 0.9 % IV SOLN: 200.0000 mg | Freq: Once | INTRAVENOUS | Status: DC

## 2019-12-13 MED ORDER — IRON SUCROSE 20 MG/ML IV SOLN
200.0000 mg | Freq: Once | INTRAVENOUS | Status: AC
  Administered 2019-12-13: 200 mg via INTRAVENOUS
  Filled 2019-12-13: qty 10

## 2019-12-13 MED ORDER — SODIUM CHLORIDE 0.9 % IV SOLN
Freq: Once | INTRAVENOUS | Status: AC
  Filled 2019-12-13: qty 250

## 2020-03-21 ENCOUNTER — Inpatient Hospital Stay: Payer: BLUE CROSS/BLUE SHIELD | Attending: Oncology

## 2020-03-21 ENCOUNTER — Inpatient Hospital Stay: Payer: Self-pay

## 2020-03-21 ENCOUNTER — Other Ambulatory Visit: Payer: Self-pay

## 2020-03-21 DIAGNOSIS — E611 Iron deficiency: Secondary | ICD-10-CM | POA: Insufficient documentation

## 2020-03-21 LAB — CBC WITH DIFFERENTIAL/PLATELET
Abs Immature Granulocytes: 0.02 10*3/uL (ref 0.00–0.07)
Basophils Absolute: 0.1 10*3/uL (ref 0.0–0.1)
Basophils Relative: 1 %
Eosinophils Absolute: 0.1 10*3/uL (ref 0.0–0.5)
Eosinophils Relative: 1 %
HCT: 41.4 % (ref 36.0–46.0)
Hemoglobin: 14.5 g/dL (ref 12.0–15.0)
Immature Granulocytes: 0 %
Lymphocytes Relative: 29 %
Lymphs Abs: 2.2 10*3/uL (ref 0.7–4.0)
MCH: 32.1 pg (ref 26.0–34.0)
MCHC: 35 g/dL (ref 30.0–36.0)
MCV: 91.6 fL (ref 80.0–100.0)
Monocytes Absolute: 0.7 10*3/uL (ref 0.1–1.0)
Monocytes Relative: 8 %
Neutro Abs: 4.7 10*3/uL (ref 1.7–7.7)
Neutrophils Relative %: 61 %
Platelets: 264 10*3/uL (ref 150–400)
RBC: 4.52 MIL/uL (ref 3.87–5.11)
RDW: 13.1 % (ref 11.5–15.5)
WBC: 7.8 10*3/uL (ref 4.0–10.5)
nRBC: 0 % (ref 0.0–0.2)

## 2020-03-21 LAB — IRON AND TIBC
Iron: 103 ug/dL (ref 28–170)
Saturation Ratios: 28 % (ref 10.4–31.8)
TIBC: 370 ug/dL (ref 250–450)
UIBC: 267 ug/dL

## 2020-03-21 LAB — FERRITIN: Ferritin: 39 ng/mL (ref 11–307)

## 2020-03-23 ENCOUNTER — Other Ambulatory Visit: Payer: Self-pay

## 2020-03-23 ENCOUNTER — Inpatient Hospital Stay: Payer: BLUE CROSS/BLUE SHIELD

## 2020-03-23 ENCOUNTER — Encounter: Payer: Self-pay | Admitting: Oncology

## 2020-03-23 ENCOUNTER — Inpatient Hospital Stay (HOSPITAL_BASED_OUTPATIENT_CLINIC_OR_DEPARTMENT_OTHER): Payer: BLUE CROSS/BLUE SHIELD | Admitting: Oncology

## 2020-03-23 VITALS — BP 123/76 | HR 76 | Temp 97.6°F | Resp 16 | Wt 172.9 lb

## 2020-03-23 DIAGNOSIS — E611 Iron deficiency: Secondary | ICD-10-CM

## 2020-03-23 NOTE — Progress Notes (Signed)
Patient denies new problems/concerns today.   °

## 2020-03-23 NOTE — Progress Notes (Signed)
Hematology/Oncology Consult note [facility] [phone] [phone]   Patient Care Team: Derinda Late, MD as PCP - General (Family Medicine)  REFERRING PROVIDER: Derinda Late, MD  CHIEF COMPLAINTS/REASON FOR VISIT:  Evaluation of iron deficiency  HISTORY OF PRESENTING ILLNESS:   Angela Delacruz is a  64 y.o.  female with PMH listed below was seen in consultation at the request of  Derinda Late, MD  for evaluation of iron deficiency  10/10/2019, WBC 7, hemoglobin 12.8, hematocrit 41, MCV 81, platelet 3 20,000.  BUN 13, creatinine 1.09, EGFR 55, Bilirubin less than 0.2, calcium 9.8, normal AST, ALT, alkaline phosphatase.  Free T4 1.09.  Vitamin D 25 hydroxy 57 Ferritin 8, Patient denies any bloody or black stool.  She is postmenopausal.   She has a history of blood donation.  Last blood donation was 4 months ago. History of IBS, esophagitis, gastritis diverticulosis, Patient follows up with [facility]  INTERVAL HISTORY Angela Delacruz is a 64 y.o. female who has above history reviewed by me today presents for follow up visit for iron deficiency Problems and complaints are listed below: Patient has received IV Venofer treatments.  Today she has no new complaints.  Feeling well.  Review of Systems  Constitutional: Negative for appetite change, chills, fatigue and fever.  HENT:   Negative for hearing loss and voice change.   Eyes: Negative for eye problems.  Respiratory: Negative for chest tightness and cough.   Cardiovascular: Negative for chest pain.  Gastrointestinal: Negative for abdominal distention, abdominal pain and blood in stool.  Endocrine: Negative for hot flashes.  Genitourinary: Negative for difficulty urinating and frequency.   Musculoskeletal: Negative for arthralgias.  Skin: Negative for itching and rash.  Neurological: Negative for extremity weakness.  Hematological: Negative for adenopathy.    Psychiatric/Behavioral: Negative for confusion.    MEDICAL HISTORY:  Past Medical History:  Diagnosis Date  . Complication of anesthesia   . GERD (gastroesophageal reflux disease)   . History of hiatal hernia   . Hypothyroidism   . Iron deficiency 11/17/2019  . PONV (postoperative nausea and vomiting)   . Seizures ([place])    EPILEPSY    SURGICAL HISTORY: Past Surgical History:  Procedure Laterality Date  . APPENDECTOMY    . AUGMENTATION MAMMAPLASTY  1996   explanted 2015  . BREAST IMPLANT REMOVAL Bilateral 2015  . CATARACT EXTRACTION W/PHACO Right 06/03/2016   Procedure: CATARACT EXTRACTION PHACO AND INTRAOCULAR LENS PLACEMENT ([place]);  Surgeon: Birder Robson, MD;  Location: [facility] ORS;  Service: Ophthalmology;  Laterality: Right;  LOT PACK: [id number] H US:01:02 AP:52.8 CDE:13.78  . CATARACT EXTRACTION W/PHACO Left 09/02/2016   Procedure: CATARACT EXTRACTION PHACO AND INTRAOCULAR LENS PLACEMENT ([facility]);  Surgeon: Birder Robson, MD;  Location: [facility] ORS;  Service: Ophthalmology;  Laterality: Left;  [place] 54.6 AP% 16.8 CDE 9.17 Fluid pack lot # [id number]  . CHOLECYSTECTOMY    . COLON SURGERY     RESECTION  . IMPLANTATION VAGAL NERVE STIMULATOR    . KNEE ARTHROSCOPY    . OOPHORECTOMY    . SALIVARY GLAND SURGERY    . SHOULDER ARTHROSCOPY    . WRIST SURGERY      SOCIAL HISTORY: Social History   Socioeconomic History  . Marital status: Married    Spouse name: Not on file  . Number of children: Not on file  . Years of education: Not on file  . Highest education level: Not on file  Occupational History  . Not on file  Tobacco  Use  . Smoking status: Former Smoker    Packs/day: 1.00    Years: 20.00    Pack years: 20.00    Quit date: 11/17/1994    Years since quitting: 25.3  . Smokeless tobacco: Never Used  Vaping Use  . Vaping Use: Never used  Substance and Sexual Activity  . Alcohol use: Yes    Comment: rare  . Drug use: No  . Sexual activity: Not on file  Other Topics  Concern  . Not on file  Social History Narrative  . Not on file   Social Determinants of Health   Financial Resource Strain:   . Difficulty of Paying Living Expenses: Not on file  Food Insecurity:   . Worried About Charity fundraiser in the Last Year: Not on file  . Ran Out of Food in the Last Year: Not on file  Transportation Needs:   . Lack of Transportation (Medical): Not on file  . Lack of Transportation (Non-Medical): Not on file  Physical Activity:   . Days of Exercise per Week: Not on file  . Minutes of Exercise per Session: Not on file  Stress:   . Feeling of Stress : Not on file  Social Connections:   . Frequency of Communication with Friends and Family: Not on file  . Frequency of Social Gatherings with Friends and Family: Not on file  . Attends Religious Services: Not on file  . Active Member of Clubs or Organizations: Not on file  . Attends Archivist Meetings: Not on file  . Marital Status: Not on file  Intimate Partner Violence:   . Fear of Current or Ex-Partner: Not on file  . Emotionally Abused: Not on file  . Physically Abused: Not on file  . Sexually Abused: Not on file    FAMILY HISTORY: Family History  Problem Relation Age of Onset  . Congenital heart disease Mother   . Pancreatic cancer Father   . Cancer Maternal Grandmother   . Lung cancer Maternal Grandfather   . Breast cancer Neg Hx     ALLERGIES:  is allergic to depakote [divalproex sodium] and dilantin [phenytoin].  MEDICATIONS:  Current Outpatient Medications  Medication Sig Dispense Refill  . calcium carbonate (OSCAL) 1500 (600 Ca) MG TABS tablet Take 600 mg of elemental calcium by mouth 2 (two) times daily with a meal.    . cholecalciferol (D-VI-SOL) 400 UNIT/ML LIQD Take 400 Units by mouth 2 (two) times daily.    Marland Kitchen estradiol (VIVELLE-DOT) 0.025 MG/24HR APPLY 1 PATCH TOPICALLY TWICE A WEEK    . lamoTRIgine (LAMICTAL) 100 MG tablet TAKE ONE HALF TABLET BY MOUTH IN THE AM AND  TAKE 1 TABLET BY MOUTH IN THE PM FOR 2 WEEKS. THEN TAKE 1 TAB TWICE DAILY FOR 2 WEEKS. TAKE TAKE    . lamoTRIgine (LAMICTAL) 25 MG tablet TAKE 1 TABLET BY MOUTH IN THE MORNING FOR 14 DAYS THEN 1 TABLET TWICE DAILY FOR 14 DAYS THEN 2 TABLET IN THE MORNING AND 1 TABLET NIGHTLY FO    . NP THYROID 30 MG tablet Take 30 mg by mouth daily.    Marland Kitchen OVER THE COUNTER MEDICATION Take 2 capsules by mouth daily. Truvision multivitamin    . amitriptyline (ELAVIL) 25 MG tablet Take 25 mg by mouth at bedtime.  (Patient not taking: Reported on 03/23/2020)    . aspirin EC 81 MG tablet Take 81 mg by mouth daily. (Patient not taking: Reported on 03/23/2020)    .  busPIRone (BUSPAR) 10 MG tablet Take 10 mg by mouth 2 (two) times daily. (Patient not taking: Reported on 03/23/2020)  3  . levETIRAcetam (KEPPRA) 750 MG tablet Take 750 mg by mouth 2 (two) times daily. (Patient not taking: Reported on 03/23/2020)    . levothyroxine (SYNTHROID, LEVOTHROID) 50 MCG tablet Take 50 mcg by mouth daily before breakfast. (Patient not taking: Reported on 03/23/2020)    . omeprazole (PRILOSEC) 40 MG capsule Take 40 mg by mouth 2 (two) times daily.    . progesterone (PROMETRIUM) 100 MG capsule progesterone micronized 100 mg capsule     No current facility-administered medications for this visit.     PHYSICAL EXAMINATION: ECOG PERFORMANCE STATUS: 0 - Asymptomatic Vitals:   03/23/20 1254  BP: 123/76  Pulse: 76  Resp: 16  Temp: 97.6 F (36.4 C)   Filed Weights   03/23/20 1254  Weight: 172 lb 14.4 oz (78.4 kg)    Physical Exam Constitutional:      General: She is not in acute distress. HENT:     Head: Normocephalic and atraumatic.  Eyes:     General: No scleral icterus. Cardiovascular:     Rate and Rhythm: Normal rate and regular rhythm.     Heart sounds: Normal heart sounds.  Pulmonary:     Effort: Pulmonary effort is normal. No respiratory distress.     Breath sounds: No wheezing.  Abdominal:     General: Bowel sounds  are normal. There is no distension.     Palpations: Abdomen is soft.  Musculoskeletal:        General: No deformity. Normal range of motion.     Cervical back: Normal range of motion and neck supple.  Skin:    General: Skin is warm and dry.     Findings: No erythema or rash.  Neurological:     Mental Status: She is alert and oriented to person, place, and time. Mental status is at baseline.     Cranial Nerves: No cranial nerve deficit.     Coordination: Coordination normal.  Psychiatric:        Mood and Affect: Mood normal.     LABORATORY DATA:  I have reviewed the data as listed Lab Results  Component Value Date   WBC 7.8 03/21/2020   HGB 14.5 03/21/2020   HCT 41.4 03/21/2020   MCV 91.6 03/21/2020   PLT 264 03/21/2020   No results for input(s): NA, K, CL, CO2, GLUCOSE, BUN, CREATININE, CALCIUM, GFRNONAA, GFRAA, PROT, ALBUMIN, AST, ALT, ALKPHOS, BILITOT, BILIDIR, IBILI in the last 8760 hours. Iron/TIBC/Ferritin/ %Sat    Component Value Date/Time   IRON 103 03/21/2020 1316   TIBC 370 03/21/2020 1316   FERRITIN 39 03/21/2020 1316   IRONPCTSAT 28 03/21/2020 1316      RADIOGRAPHIC STUDIES: I have personally reviewed the radiological images as listed and agreed with the findings in the report. No results found. 12/09/2018 CT abdomen pelvis with contrast Wall thickening in the distal stomach and proximal duodenum consistent with a degree of gastritis and proximal duodenitis Hepatic steatosis.  Gallbladder absent.  Small hiatal hernia.   ASSESSMENT & PLAN:  1. Iron deficiency   Labs are reviewed with patient. Iron panel has completely normalized.  Hemoglobin continues to be within normal limits. No need for additional IV iron or oral iron supplementation at this point. I recommend patient to continue follow-up with primary care provider as well as gastroenterology. I am happy to see her in the future if she  develops additional iron deficiency or other related symptoms.   Patient will be discharged. No orders of the defined types were placed in this encounter.   All questions were answered. The patient knows to call the clinic with any problems questions or concerns.  cc Derinda Late, MD    Thank you for this kind referral and the opportunity to participate in the care of this patient. A copy of today's note is routed to referring provider    Earlie Server, MD, PhD Hematology Oncology [facility] at [facility] Pager- [phone] 03/23/2020

## 2020-04-19 ENCOUNTER — Other Ambulatory Visit: Payer: Self-pay | Admitting: Obstetrics & Gynecology

## 2020-04-19 DIAGNOSIS — Z1231 Encounter for screening mammogram for malignant neoplasm of breast: Secondary | ICD-10-CM

## 2020-06-21 ENCOUNTER — Other Ambulatory Visit: Payer: Self-pay | Admitting: Neurology

## 2020-06-21 DIAGNOSIS — G40909 Epilepsy, unspecified, not intractable, without status epilepticus: Secondary | ICD-10-CM

## 2020-06-22 ENCOUNTER — Other Ambulatory Visit: Payer: Self-pay

## 2020-06-22 ENCOUNTER — Ambulatory Visit
Admission: RE | Admit: 2020-06-22 | Discharge: 2020-06-22 | Disposition: A | Discharge status: Home / Self Care | Payer: BLUE CROSS/BLUE SHIELD | Source: Ambulatory Visit | Attending: Obstetrics & Gynecology | Admitting: Obstetrics & Gynecology

## 2020-06-22 DIAGNOSIS — Z1231 Encounter for screening mammogram for malignant neoplasm of breast: Secondary | ICD-10-CM | POA: Insufficient documentation

## 2020-06-22 IMAGING — MG DIGITAL SCREENING BILAT W/ TOMO W/ CAD
8 series · 8 of 24 positions shown · non-contrast
Comparison: Previous exam(s).

CLINICAL DATA: Screening.

EXAM:
DIGITAL SCREENING BILATERAL MAMMOGRAM WITH TOMO AND CAD

[R MLO synth-2D]
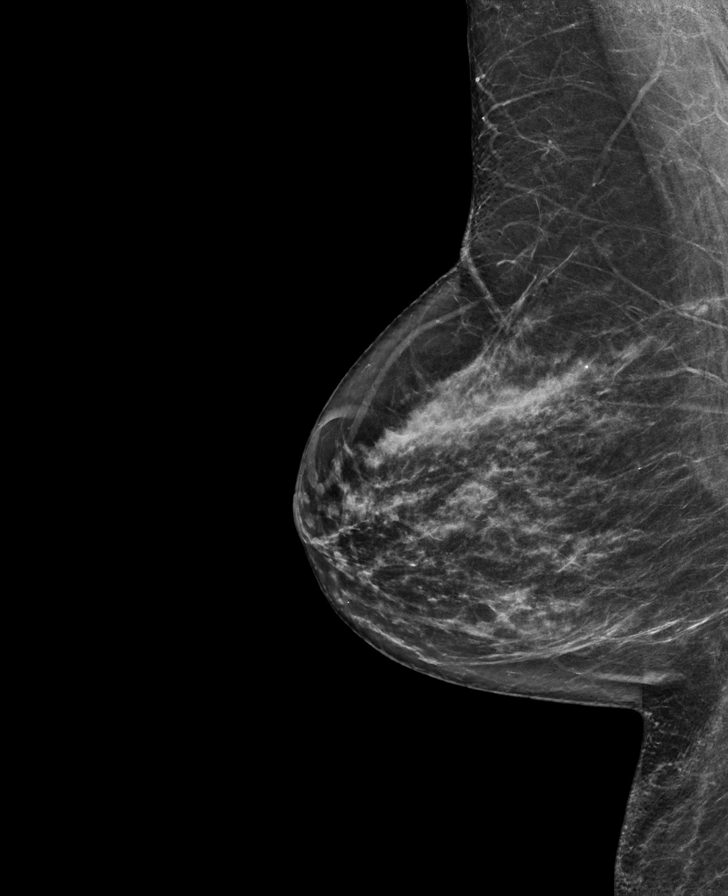

[R CC synth-2D]
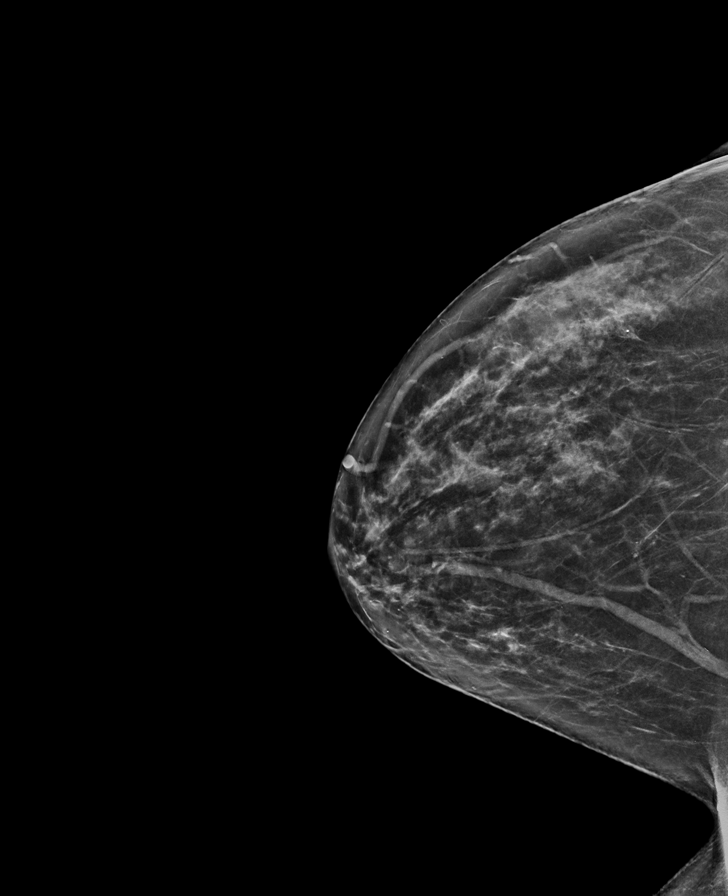

[L MLO synth-2D]
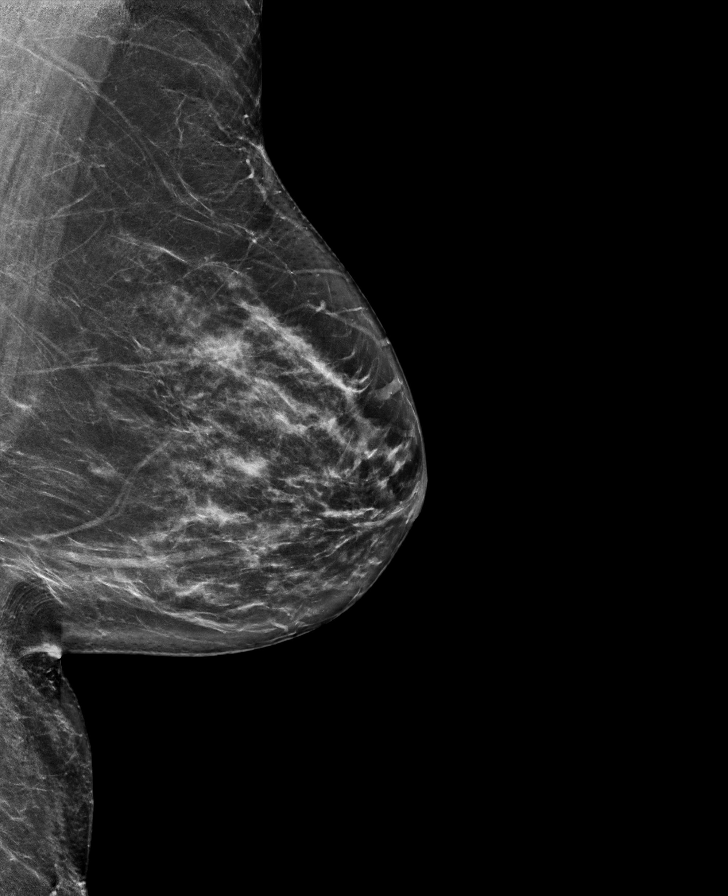

[L CC synth-2D]
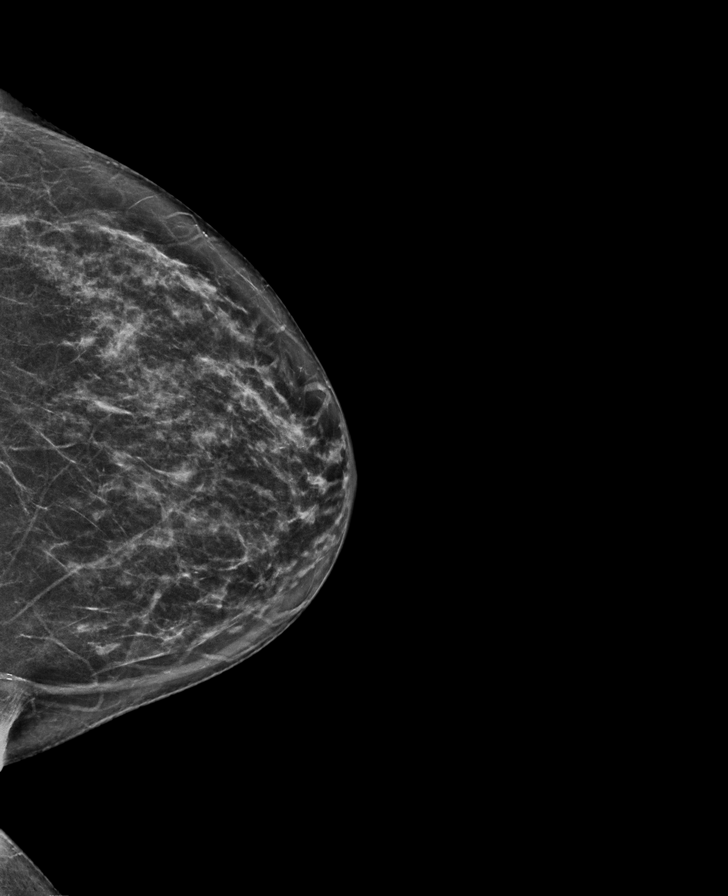

[L CC tomo · tomo slice 34/67.0]
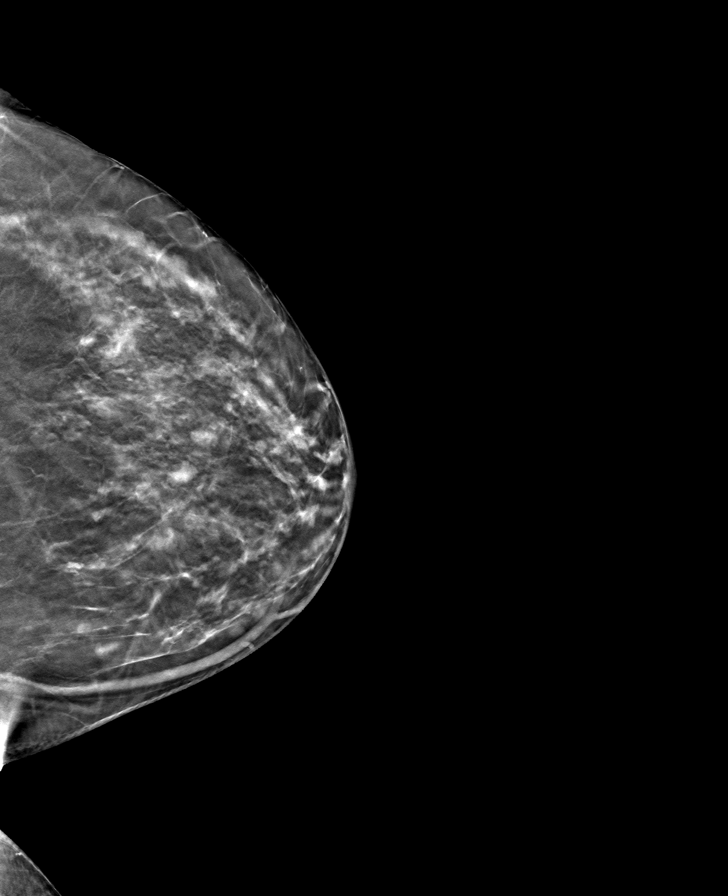

[R MLO tomo · tomo slice 36/71.0]
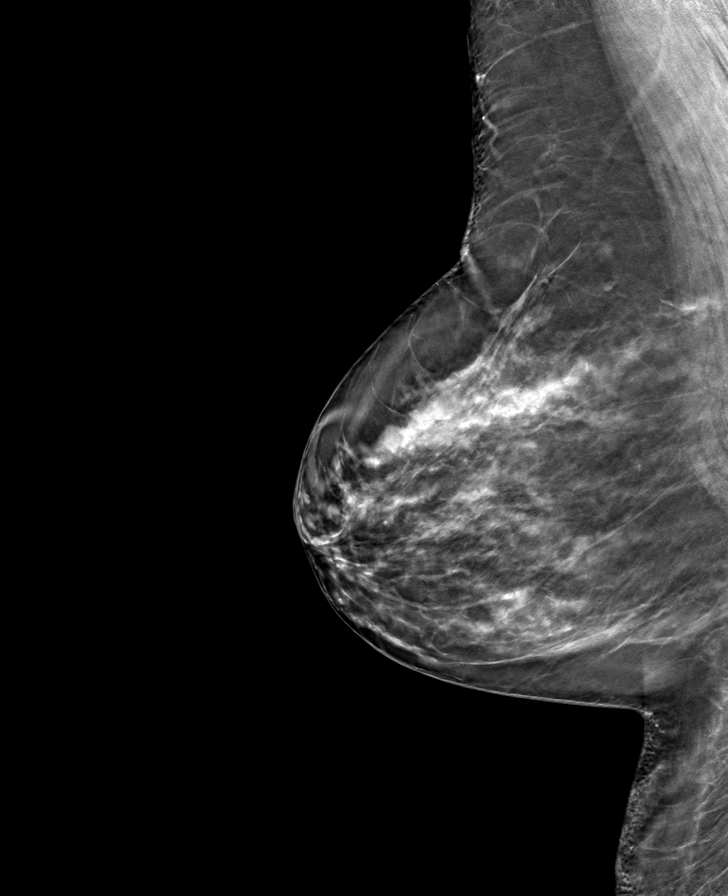

[L MLO tomo · tomo slice 39/76.0]
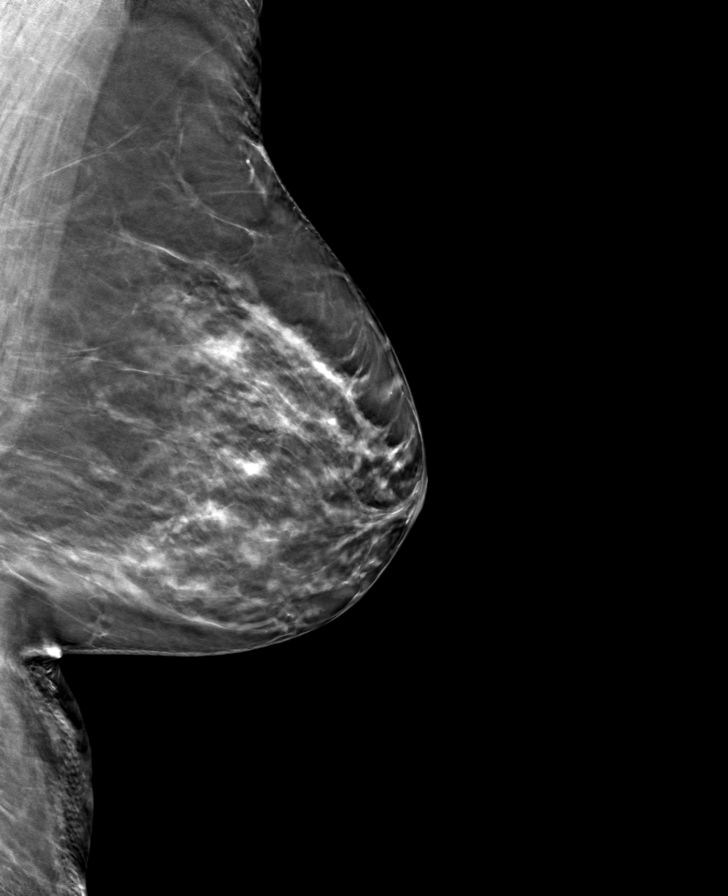

[R CC tomo · tomo slice 36/71.0]
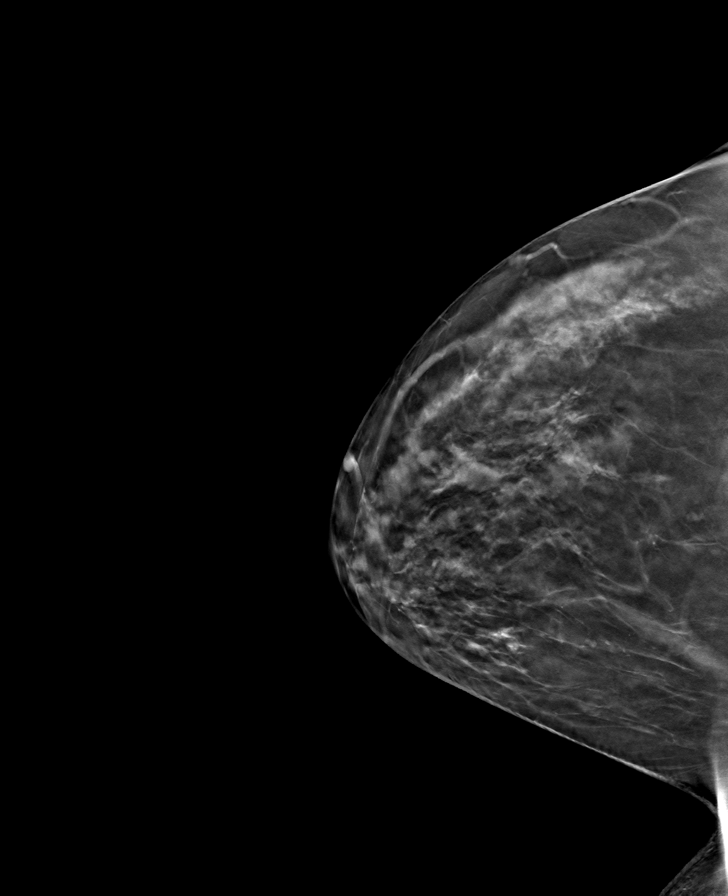

[8 of 24 positions shown; findings below may reference images not displayed]

ACR Breast Density Category c: The breast tissue is heterogeneously
dense, which may obscure small masses.
FINDINGS: There are no findings suspicious for malignancy. Images were
processed with CAD.
IMPRESSION: No mammographic evidence of malignancy. A result letter of this
screening mammogram will be mailed directly to the patient.

RECOMMENDATION:
Screening mammogram in one year. (Code:[5V])

BI-RADS CATEGORY  1: Negative.

## 2020-07-20 ENCOUNTER — Ambulatory Visit
Admission: RE | Admit: 2020-07-20 | Discharge: 2020-07-20 | Disposition: A | Discharge status: Home / Self Care | Payer: BLUE CROSS/BLUE SHIELD | Source: Ambulatory Visit | Attending: Neurology | Admitting: Neurology

## 2020-07-20 ENCOUNTER — Other Ambulatory Visit: Payer: Self-pay

## 2020-07-20 DIAGNOSIS — G40909 Epilepsy, unspecified, not intractable, without status epilepticus: Secondary | ICD-10-CM | POA: Diagnosis not present

## 2020-07-20 IMAGING — MR MR HEAD WO/W CM
15 series · 40 of 48 positions shown · IV contrast (gadavist)
Comparison: None.

CLINICAL DATA: Non intractable epilepsy without status epilepticus.

EXAM:
MRI HEAD WITHOUT AND WITH CONTRAST
TECHNIQUE: Multiplanar, multiecho pulse sequences of the brain and surrounding
structures were obtained without and with intravenous contrast.
CONTRAST:  7.5mL GADAVIST GADOBUTROL 1 MMOL/ML IV SOLN

[Series 2: T1 · sagittal · 5.0mm · 0.62mm/px · 2 of 21 slices shown (1 of 2)]
[im 1/21]
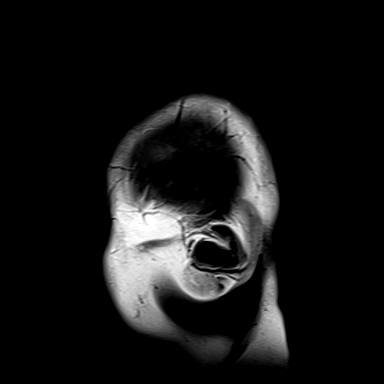
[im 21/21]
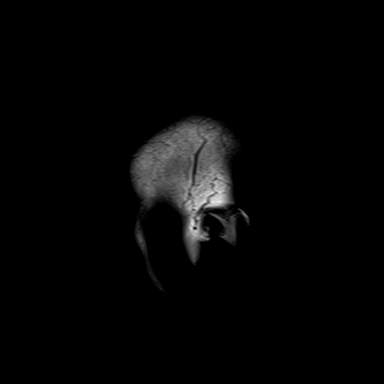

[Series 3: ax dwi_tracew · axial · 5.0mm · 1.44mm/px · 1 of 25 slices shown]
[im 1/25]
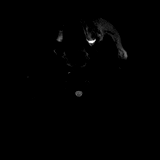

[Series 4: ax dwi_adc · axial · 5.0mm · 1.44mm/px · 1 of 25 slices shown]
[im 1/25]
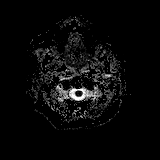

[Series 5: T2 · axial · 5.0mm · 0.53mm/px · z∈[-58,+95]mm · 2 of 27 slices shown (1 of 4)]
[im 1/27]
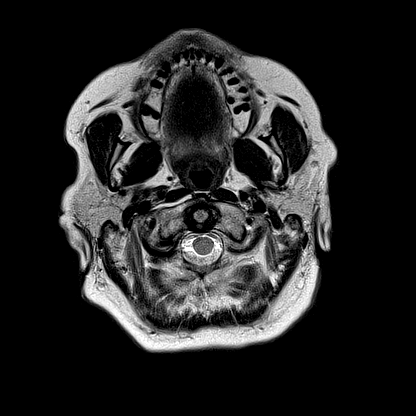
[im 27/27]
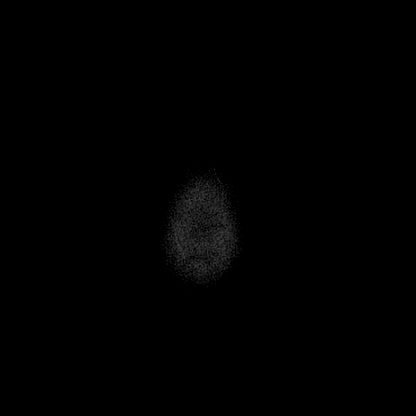

[Series 6: T2 · coronal · 3.0mm · 0.47mm/px · 2 of 35 slices shown (2 of 4)]
[im 1/35]
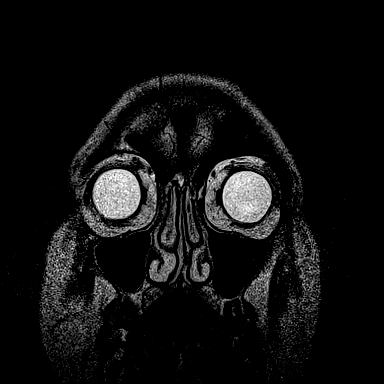
[im 35/35]
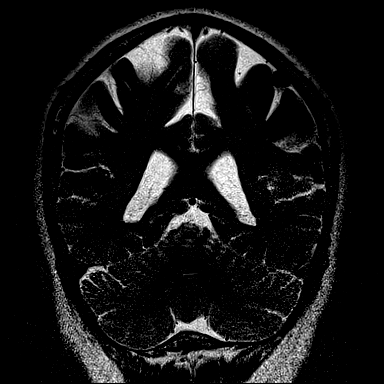

[Series 7: T2-star · axial · 5.0mm · 0.43mm/px · 1 of 25 slices shown]
[im 1/25]
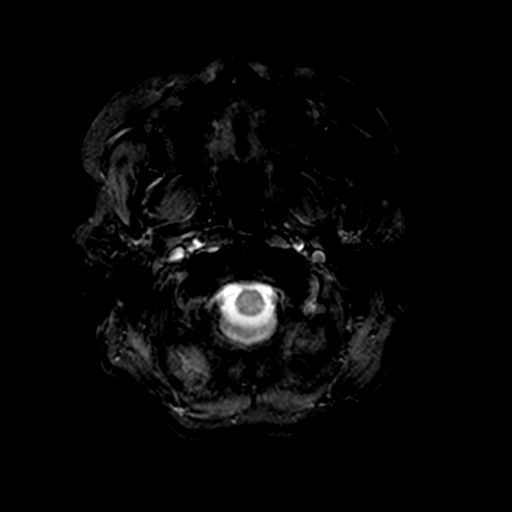

[Series 8: FLAIR · axial · 3.0mm · 0.53mm/px · z∈[-61,+98]mm · 3 of 55 slices shown (1 of 3)]
[im 1/55]
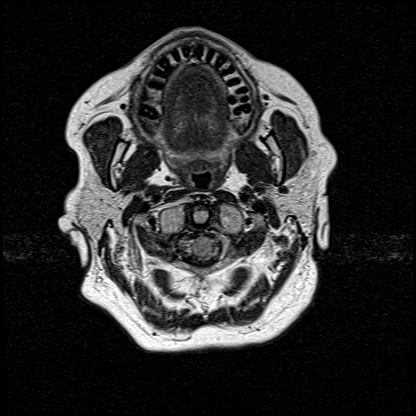
[im 28/55]
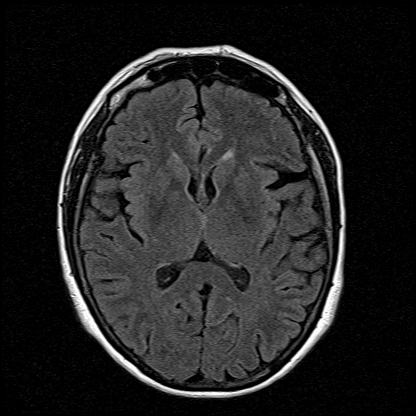
[im 55/55]
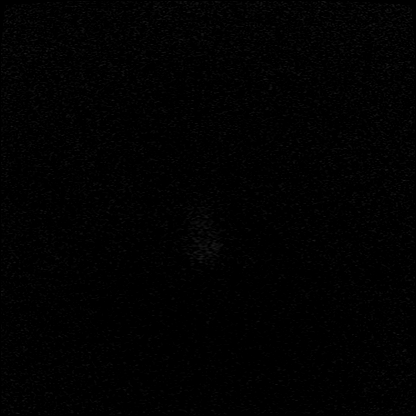

[Series 9: FLAIR · coronal · 3.0mm · 0.47mm/px · 2 of 35 slices shown (2 of 3)]
[im 1/35]
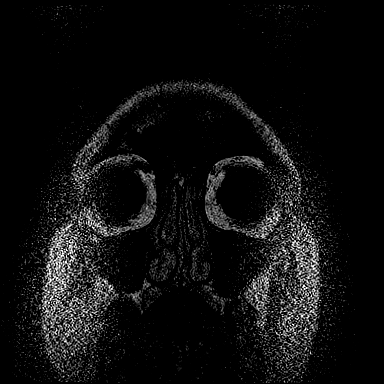
[im 35/35]
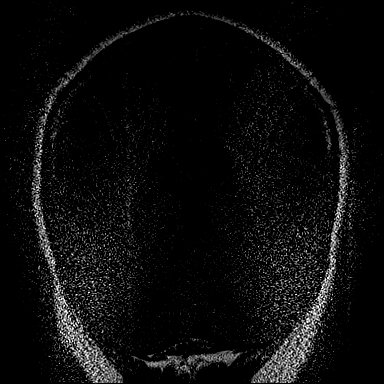

[Series 10: T1 · axial · 1.0mm · 0.98mm/px · z∈[-70,+94]mm · 8 of 169 slices shown (2 of 2)]
[im 1/169]
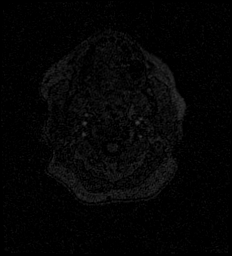
[im 19/169]
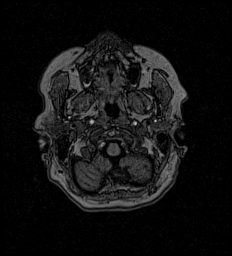
[im 57/169]
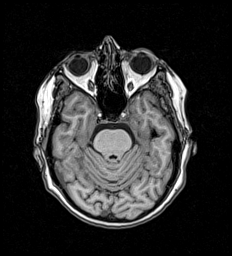
[im 75/169]
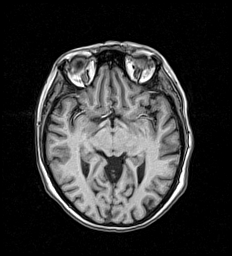
[im 94/169]
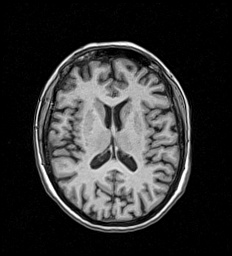
[im 113/169]
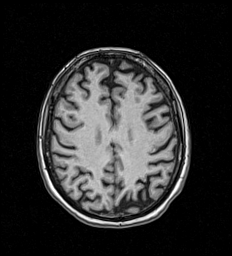
[im 150/169]
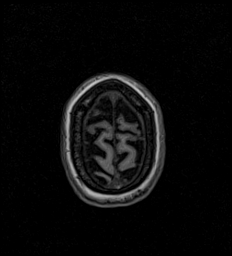
[im 169/169]
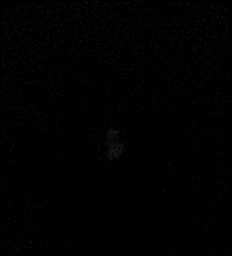

[Series 11: T2 · coronal · 5.0mm · 0.57mm/px · 2 of 29 slices shown (3 of 4)]
[im 1/29]
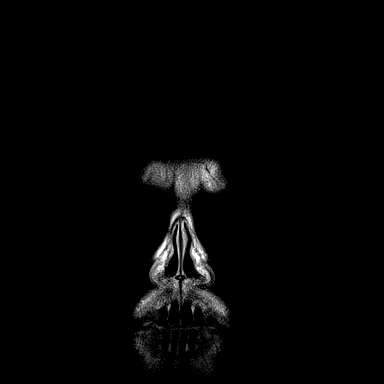
[im 29/29]
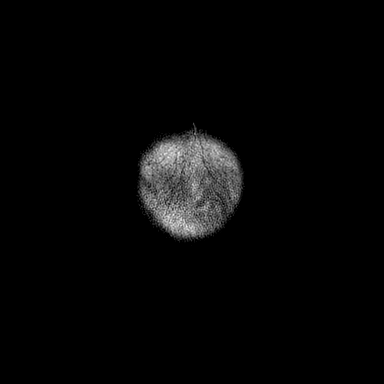

[Series 12: FLAIR · coronal · 3.0mm · 0.53mm/px · 2 of 35 slices shown (3 of 3)]
[im 1/35]
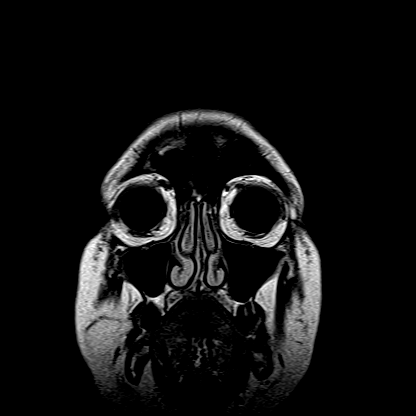
[im 35/35]
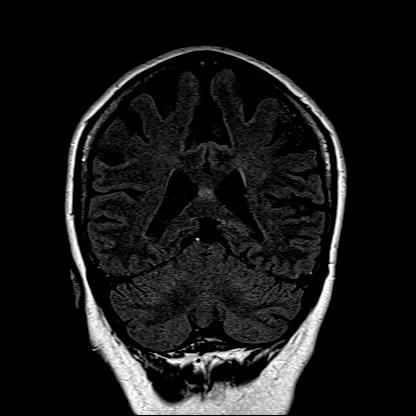

[Series 13: T2 · coronal · 3.0mm · 0.53mm/px · 2 of 35 slices shown (4 of 4)]
[im 1/35]
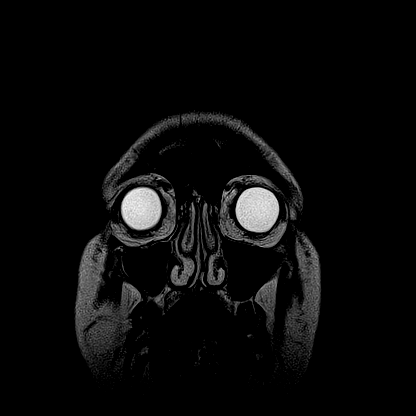
[im 35/35]
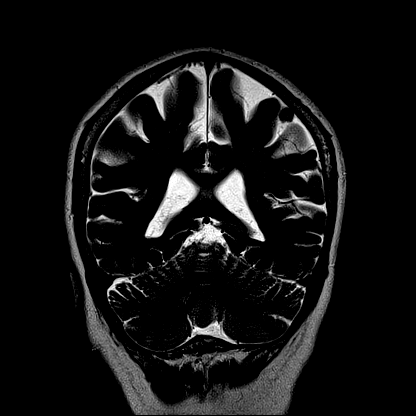

[Series 14: T1 post-contrast · axial · 1.0mm · 0.98mm/px · z∈[-70,+98]mm · 8 of 170 slices shown (1 of 2)]
[im 1/170]
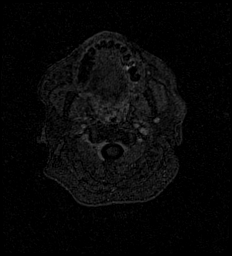
[im 19/170]
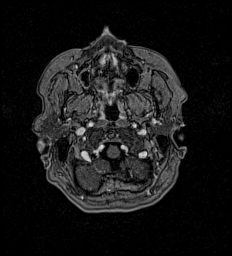
[im 57/170]
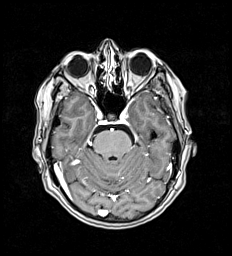
[im 76/170]
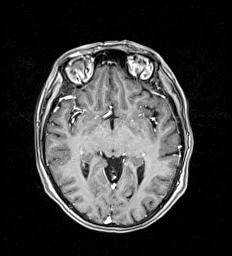
[im 94/170]
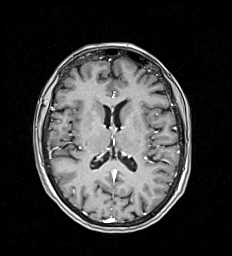
[im 113/170]
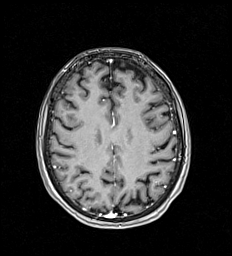
[im 151/170]
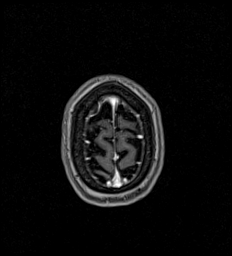
[im 170/170]
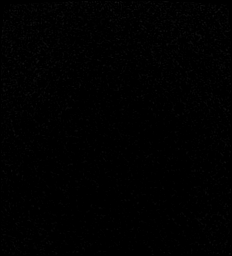

[Series 15: T1 post-contrast · coronal · 5.0mm · 0.57mm/px · 2 of 29 slices shown (2 of 2)]
[im 1/29]
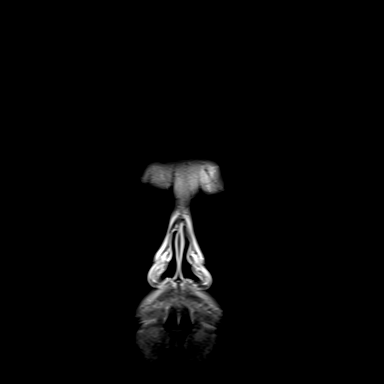
[im 29/29]
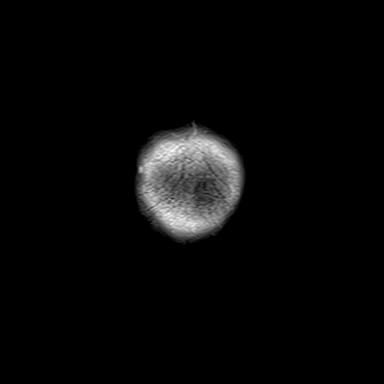

[Series 1016: cor thins rl · coronal · 3.0mm · 0.49mm/px · 2 of 107 slices shown]
[im 1/107]
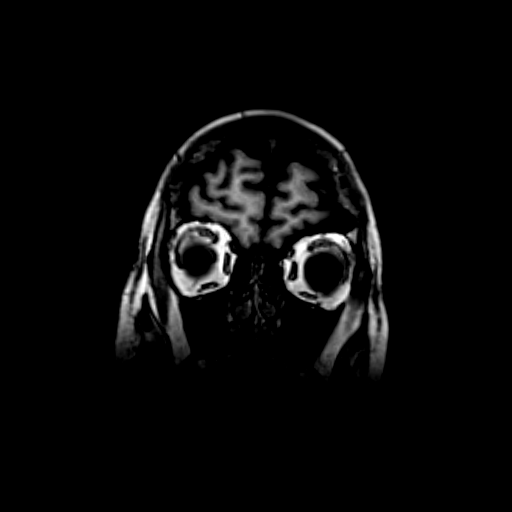
[im 22/107]
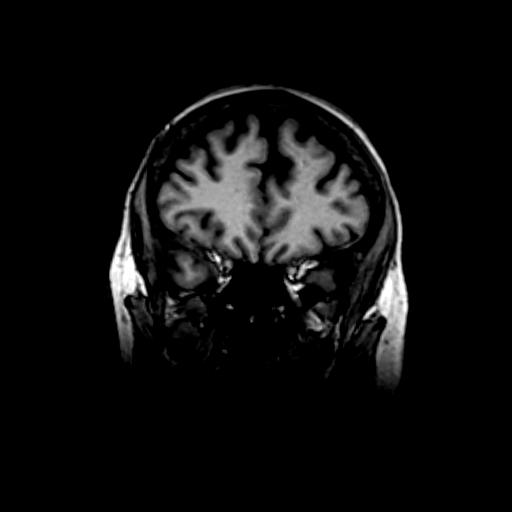

[40 of 48 positions shown; findings below may reference images not displayed]

FINDINGS: Brain: No acute infarction, hemorrhage, hydrocephalus or extra-axial
collection.

Small amount of scattered foci of T2 hyperintensity within the white
matter of the cerebral hemispheres, nonspecific, most likely related
to mild chronic microangiopathic changes. Mesial temporal lobes
appear symmetric.

A 1.1 by 0.6 cm dural-based extra-axial lesion in the left posterior
frontal region most consistent with a small meningioma. No mass
effect on the adjacent in in brain parenchyma.

Vascular: Normal flow voids.

Skull and upper cervical spine: Normal marrow signal.

Sinuses/Orbits: Bilateral lens surgery. Paranasal sinuses are clear.
IMPRESSION: 1. No acute intracranial abnormality.
2. A 1.1 x 0.6 cm dural-based extra-axial lesion in the left
posterior frontal region most consistent with a small meningioma.
3. Mild chronic microangiopathic changes.

## 2020-07-20 MED ORDER — GADOBUTROL 1 MMOL/ML IV SOLN
7.5000 mL | Freq: Once | INTRAVENOUS | Status: AC | PRN
  Administered 2020-07-20: 7.5 mL via INTRAVENOUS

## 2020-07-28 DIAGNOSIS — Z9689 Presence of other specified functional implants: Secondary | ICD-10-CM | POA: Insufficient documentation

## 2020-10-30 ENCOUNTER — Encounter (INDEPENDENT_AMBULATORY_CARE_PROVIDER_SITE_OTHER): Payer: Self-pay | Admitting: Vascular Surgery

## 2020-10-30 ENCOUNTER — Other Ambulatory Visit: Payer: Self-pay

## 2020-10-30 ENCOUNTER — Ambulatory Visit (INDEPENDENT_AMBULATORY_CARE_PROVIDER_SITE_OTHER): Payer: BLUE CROSS/BLUE SHIELD | Admitting: Vascular Surgery

## 2020-10-30 VITALS — BP 142/85 | HR 81 | Resp 16 | Ht 66.0 in | Wt 177.0 lb

## 2020-10-30 DIAGNOSIS — N289 Disorder of kidney and ureter, unspecified: Secondary | ICD-10-CM

## 2020-10-30 DIAGNOSIS — I83813 Varicose veins of bilateral lower extremities with pain: Secondary | ICD-10-CM

## 2020-10-30 DIAGNOSIS — M7989 Other specified soft tissue disorders: Secondary | ICD-10-CM

## 2020-10-30 NOTE — Patient Instructions (Signed)

## 2020-10-30 NOTE — Assessment & Plan Note (Signed)
Recommend:  The patient has large symptomatic varicose veins that are painful and associated with swelling.  I have had a long discussion with the patient regarding  varicose veins and why they cause symptoms.  Patient will begin wearing graduated compression stockings class 1 on a daily basis, beginning first thing in the morning and removing them in the evening. The patient is instructed specifically not to sleep in the stockings.    The patient  will also begin using over-the-counter analgesics such as Motrin 600 mg po TID to help control the symptoms.    In addition, behavioral modification including elevation during the day will be initiated.    Pending the results of these changes the  patient will be reevaluated in three months.   An  ultrasound of the venous system will be obtained.   Further plans will be based on the ultrasound results and whether conservative therapies are successful at eliminating the pain and swelling.  

## 2020-10-30 NOTE — Assessment & Plan Note (Signed)
Can worsen lower extremity edema. 

## 2020-10-30 NOTE — Progress Notes (Signed)
Patient ID: Angela Delacruz, female   DOB: July 16, 1955, 65 y.o.   MRN: [id number]  Chief Complaint  Patient presents with  . New Patient (Initial Visit)    Ref Sunday Corn PVD    HPI Angela Delacruz is a 65 y.o. female.  I am asked to see the patient by ASunday Corn, PA-C for evaluation of pain and swelling in her legs.  This has been a steadily progressive problem over several years.  Both legs are affected.  A year or so ago, she had an ultrasound from an outside vein clinic which suggested bilateral great saphenous vein reflux.  She has tried compression stockings and elevation with no improvement.  She does not have ulceration or infection, but does have pain and swelling on a daily basis that impacts her normal activities.  No previous history of DVT or superficial thrombophlebitis to her knowledge.  Both legs are affected and by this report both legs had reflux but again that had been almost a year ago.  No claudication symptoms.  No chest pain or shortness of breath.     Past Medical History:  Diagnosis Date  . Complication of anesthesia   . GERD (gastroesophageal reflux disease)   . History of hiatal hernia   . Hypothyroidism   . Iron deficiency 11/17/2019  . PONV (postoperative nausea and vomiting)   . Seizures ([place])    EPILEPSY    Past Surgical History:  Procedure Laterality Date  . APPENDECTOMY    . AUGMENTATION MAMMAPLASTY  1996   explanted 2015  . BREAST IMPLANT REMOVAL Bilateral 2015  . CATARACT EXTRACTION W/PHACO Right 06/03/2016   Procedure: CATARACT EXTRACTION PHACO AND INTRAOCULAR LENS PLACEMENT ([place]);  Surgeon: Birder Robson, MD;  Location: [facility] ORS;  Service: Ophthalmology;  Laterality: Right;  LOT PACK: [id number] H US:01:02 AP:52.8 CDE:13.78  . CATARACT EXTRACTION W/PHACO Left 09/02/2016   Procedure: CATARACT EXTRACTION PHACO AND INTRAOCULAR LENS PLACEMENT ([facility]);  Surgeon: Birder Robson, MD;  Location: [facility] OR[facility];  Service: Ophthalmology;  Laterality: Left;  [place] 54.6 AP%  16.8 CDE 9.17 Fluid pack lot # [id number]  . CHOLECYSTECTOMY    . COLON SURGERY     RESECTION  . IMPLANTATION VAGAL NERVE STIMULATOR    . KNEE ARTHROSCOPY    . OOPHORECTOMY    . SALIVARY GLAND SURGERY    . SHOULDER ARTHROSCOPY    . WRIST SURGERY       Family History  Problem Relation Age of Onset  . Congenital heart disease Mother   . Pancreatic cancer Father   . Cancer Maternal Grandmother   . Lung cancer Maternal Grandfather   . Breast cancer Neg Hx      Social History   Tobacco Use  . Smoking status: Former Smoker    Packs/day: 1.00    Years: 20.00    Pack years: 20.00    Quit date: 11/17/1994    Years since quitting: 25.9  . Smokeless tobacco: Never Used  Vaping Use  . Vaping Use: Never used  Substance Use Topics  . Alcohol use: Yes    Comment: rare  . Drug use: No    Allergies  Allergen Reactions  . Depakote [Divalproex Sodium] Other (See Comments)    IV form caused burning in the arm and shoulder  . Dilantin [Phenytoin] Other (See Comments)    IV form caused burning in the arm and shoulder    Current Outpatient Medications  Medication Sig Dispense Refill  . estradiol (VIVELLE-DOT) 0.025 MG/24HR APPLY 1  PATCH TOPICALLY TWICE A WEEK    . lamoTRIgine (LAMICTAL) 200 MG tablet 2 (two) times daily.    . NP THYROID 30 MG tablet Take 30 mg by mouth daily.    . progesterone (PROMETRIUM) 100 MG capsule progesterone micronized 100 mg capsule    . amitriptyline (ELAVIL) 25 MG tablet Take 25 mg by mouth at bedtime.  (Patient not taking: No sig reported)    . aspirin EC 81 MG tablet Take 81 mg by mouth daily. (Patient not taking: No sig reported)    . busPIRone (BUSPAR) 10 MG tablet Take 10 mg by mouth 2 (two) times daily. (Patient not taking: No sig reported)  3  . calcium carbonate (OSCAL) 1500 (600 Ca) MG TABS tablet Take 600 mg of elemental calcium by mouth 2 (two) times daily with a meal. (Patient not taking: Reported on 10/30/2020)    . cholecalciferol (D-VI-SOL)  400 UNIT/ML LIQD Take 400 Units by mouth 2 (two) times daily. (Patient not taking: Reported on 10/30/2020)    . lamoTRIgine (LAMICTAL) 25 MG tablet TAKE 1 TABLET BY MOUTH IN THE MORNING FOR 14 DAYS THEN 1 TABLET TWICE DAILY FOR 14 DAYS THEN 2 TABLET IN THE MORNING AND 1 TABLET NIGHTLY FO (Patient not taking: Reported on 10/30/2020)    . levETIRAcetam (KEPPRA) 750 MG tablet Take 750 mg by mouth 2 (two) times daily. (Patient not taking: No sig reported)    . levothyroxine (SYNTHROID, LEVOTHROID) 50 MCG tablet Take 50 mcg by mouth daily before breakfast. (Patient not taking: No sig reported)    . omeprazole (PRILOSEC) 40 MG capsule Take 40 mg by mouth 2 (two) times daily.    Marland Kitchen OVER THE COUNTER MEDICATION Take 2 capsules by mouth daily. Truvision multivitamin (Patient not taking: Reported on 10/30/2020)     No current facility-administered medications for this visit.      REVIEW OF SYSTEMS (Negative unless checked)  Constitutional: [] Weight loss  [] Fever  [] Chills Cardiac: [] Chest pain   [] Chest pressure   [] Palpitations   [] Shortness of breath when laying flat   [] Shortness of breath at rest   [] Shortness of breath with exertion. Vascular:  [x] Pain in legs with walking   [x] Pain in legs at rest   [] Pain in legs when laying flat   [] Claudication   [] Pain in feet when walking  [] Pain in feet at rest  [] Pain in feet when laying flat   [] History of DVT   [] Phlebitis   [x] Swelling in legs   [x] Varicose veins   [] Non-healing ulcers Pulmonary:   [] Uses home oxygen   [] Productive cough   [] Hemoptysis   [] Wheeze  [] COPD   [] Asthma Neurologic:  [] Dizziness  [] Blackouts   [x] Seizures   [] History of stroke   [] History of TIA  [] Aphasia   [] Temporary blindness   [] Dysphagia   [] Weakness or numbness in arms   [] Weakness or numbness in legs Musculoskeletal:  [] Arthritis   [] Joint swelling   [] Joint pain   [] Low back pain Hematologic:  [] Easy bruising  [] Easy bleeding   [] Hypercoagulable state   [x] Anemic   [] Hepatitis Gastrointestinal:  [] Blood in stool   [] Vomiting blood  [x] Gastroesophageal reflux/heartburn   [] Abdominal pain Genitourinary:  [x] Chronic kidney disease   [] Difficult urination  [] Frequent urination  [] Burning with urination   [] Hematuria Skin:  [] Rashes   [] Ulcers   [] Wounds Psychological:  [] History of anxiety   []  History of major depression.    Physical Exam BP (!) 142/85 (BP Location: Right Arm)  Pulse 81   Resp 16   Ht 5\' 6"  (1.676 m)   Wt 177 lb (80.3 kg)   BMI 28.57 kg/m  Gen:  WD/WN, NAD.  Appears younger than stated age Head: [place]/AT, No temporalis wasting.  Ear/Nose/Throat: Hearing grossly intact, nares w/o erythema or drainage, oropharynx w/o Erythema/Exudate Eyes: Conjunctiva clear, sclera non-icteric  Neck: trachea midline.  No JVD.  Pulmonary:  Good air movement, respirations not labored, no use of accessory muscles  Cardiac: RRR, no JVD Vascular:  Vessel Right Left  Radial Palpable Palpable                          DP  2+  2+  PT  2+  2+   Gastrointestinal:. No masses, surgical incisions, or scars. Musculoskeletal: M/S 5/5 throughout.  Extremities without ischemic changes.  No deformity or atrophy.  Scattered varicosities present bilaterally a little worse on the left than the right.  1+ bilateral lower extremity edema. Neurologic: Sensation grossly intact in extremities.  Symmetrical.  Speech is fluent. Motor exam as listed above. Psychiatric: Judgment intact, Mood & affect appropriate for pt's clinical situation. Dermatologic: No rashes or ulcers noted.  No cellulitis or open wounds.    Radiology No results found.  Labs No results found for this or any previous visit (from the past 2160 hour(s)).  Assessment/Plan:  Varicose veins of leg with pain, bilateral  Recommend:  The patient has large symptomatic varicose veins that are painful and associated with swelling.  I have had a long discussion with the patient regarding  varicose  veins and why they cause symptoms.  Patient will begin wearing graduated compression stockings class 1 on a daily basis, beginning first thing in the morning and removing them in the evening. The patient is instructed specifically not to sleep in the stockings.    The patient  will also begin using over-the-counter analgesics such as Motrin 600 mg po TID to help control the symptoms.    In addition, behavioral modification including elevation during the day will be initiated.    Pending the results of these changes the  patient will be reevaluated in three months.   An  ultrasound of the venous system will be obtained.   Further plans will be based on the ultrasound results and whether conservative therapies are successful at eliminating the pain and swelling.   Mild renal insufficiency Can worsen lower extremity edema.  Swelling of limb Likely predominantly from venous insufficiency.  May have developed a component of lymphedema as well.  Continue to wear her compression stockings and elevate her legs.      Leotis Pain 10/30/2020, 3:40 PM   This note was created with Dragon medical transcription system.  Any errors from dictation are unintentional.

## 2020-10-30 NOTE — Assessment & Plan Note (Signed)
Likely predominantly from venous insufficiency.  May have developed a component of lymphedema as well.  Continue to wear her compression stockings and elevate her legs.

## 2021-01-08 ENCOUNTER — Encounter: Payer: Self-pay | Admitting: Oncology

## 2021-01-14 ENCOUNTER — Encounter: Payer: Self-pay | Admitting: Oncology

## 2021-01-29 ENCOUNTER — Ambulatory Visit (INDEPENDENT_AMBULATORY_CARE_PROVIDER_SITE_OTHER): Payer: BLUE CROSS/BLUE SHIELD

## 2021-01-29 ENCOUNTER — Ambulatory Visit (INDEPENDENT_AMBULATORY_CARE_PROVIDER_SITE_OTHER): Payer: BLUE CROSS/BLUE SHIELD | Admitting: Vascular Surgery

## 2021-01-29 ENCOUNTER — Encounter (INDEPENDENT_AMBULATORY_CARE_PROVIDER_SITE_OTHER): Payer: Self-pay | Admitting: Vascular Surgery

## 2021-01-29 ENCOUNTER — Other Ambulatory Visit: Payer: Self-pay

## 2021-01-29 VITALS — BP 152/83 | HR 80 | Resp 16 | Wt 176.4 lb

## 2021-01-29 DIAGNOSIS — I83813 Varicose veins of bilateral lower extremities with pain: Secondary | ICD-10-CM

## 2021-01-29 DIAGNOSIS — N289 Disorder of kidney and ureter, unspecified: Secondary | ICD-10-CM | POA: Diagnosis not present

## 2021-01-29 NOTE — Assessment & Plan Note (Signed)
Duplex today shows reflux in the right great saphenous vein and no reflux in the left leg.  No DVT or superficial thrombophlebitis was seen in either leg.  The patient's symptoms are roughly the same on each side, and we discussed that laser ablation could be offered on the right leg but it is hard to predict how much improvement she will see as her symptoms are roughly equal bilaterally.  I would still expect some improvement in pain and swelling, but clearly would not be complete.  There is definitely a component of lymphedema as well.  At this point, we discussed laser ablation but the patient would prefer to wait and continue conservative measures.  She is going to do this for 6 months and we will see her back at that time.  I am going to repeat a duplex as her left leg may be borderline for reflux and we can recheck this next year.

## 2021-01-29 NOTE — Progress Notes (Signed)
MRN : [id number]  Tabbetha Delacruz is a 65 y.o. (1955/09/04) female who presents with chief complaint of  Chief Complaint  Patient presents with   Follow-up    3 month ultrasound follow up  .  History of Present Illness: Patient returns today in follow up of her leg pain and swelling.  This is basically the same as it was at her last visit.  Compression stockings have helped minimally.  No new wounds or ulcerations.  No fevers or chills.  Reflux study today showed reflux in the right great saphenous vein only.  No left leg reflux was seen.  No DVT or superficial thrombophlebitis was seen.  Current Outpatient Medications  Medication Sig Dispense Refill   estradiol (VIVELLE-DOT) 0.025 MG/24HR APPLY 1 PATCH TOPICALLY TWICE A WEEK     lamoTRIgine (LAMICTAL) 200 MG tablet 2 (two) times daily.     NP THYROID 30 MG tablet Take 30 mg by mouth daily.     progesterone (PROMETRIUM) 100 MG capsule progesterone micronized 100 mg capsule     amitriptyline (ELAVIL) 25 MG tablet Take 25 mg by mouth at bedtime.  (Patient not taking: No sig reported)     aspirin EC 81 MG tablet Take 81 mg by mouth daily. (Patient not taking: No sig reported)     busPIRone (BUSPAR) 10 MG tablet Take 10 mg by mouth 2 (two) times daily. (Patient not taking: No sig reported)  3   calcium carbonate (OSCAL) 1500 (600 Ca) MG TABS tablet Take 600 mg of elemental calcium by mouth 2 (two) times daily with a meal. (Patient not taking: No sig reported)     cholecalciferol (D-VI-SOL) 400 UNIT/ML LIQD Take 400 Units by mouth 2 (two) times daily. (Patient not taking: No sig reported)     lamoTRIgine (LAMICTAL) 25 MG tablet TAKE 1 TABLET BY MOUTH IN THE MORNING FOR 14 DAYS THEN 1 TABLET TWICE DAILY FOR 14 DAYS THEN 2 TABLET IN THE MORNING AND 1 TABLET NIGHTLY FO (Patient not taking: No sig reported)     levETIRAcetam (KEPPRA) 750 MG tablet Take 750 mg by mouth 2 (two) times daily. (Patient not taking: No sig reported)     levothyroxine  (SYNTHROID, LEVOTHROID) 50 MCG tablet Take 50 mcg by mouth daily before breakfast. (Patient not taking: No sig reported)     omeprazole (PRILOSEC) 40 MG capsule Take 40 mg by mouth 2 (two) times daily.     OVER THE COUNTER MEDICATION Take 2 capsules by mouth daily. Truvision multivitamin (Patient not taking: No sig reported)     No current facility-administered medications for this visit.    Past Medical History:  Diagnosis Date   Complication of anesthesia    GERD (gastroesophageal reflux disease)    History of hiatal hernia    Hypothyroidism    Iron deficiency 11/17/2019   PONV (postoperative nausea and vomiting)    Seizures ([place])    EPILEPSY    Past Surgical History:  Procedure Laterality Date   APPENDECTOMY     AUGMENTATION MAMMAPLASTY  1996   explanted 2015   BREAST IMPLANT REMOVAL Bilateral 2015   CATARACT EXTRACTION W/PHACO Right 06/03/2016   Procedure: CATARACT EXTRACTION PHACO AND INTRAOCULAR LENS PLACEMENT ([place]);  Surgeon: Birder Robson, MD;  Location: [facility] ORS;  Service: Ophthalmology;  Laterality: Right;  LOT PACK: [id number] H US:01:02 AP:52.8 CDE:13.78   CATARACT EXTRACTION W/PHACO Left 09/02/2016   Procedure: CATARACT EXTRACTION PHACO AND INTRAOCULAR LENS PLACEMENT ([facility]);  Surgeon: Birder Robson, MD;  Location: [facility] OR[facility];  Service: Ophthalmology;  Laterality: Left;  [place] 54.6 AP% 16.8 CDE 9.17 Fluid pack lot # [id number]   CHOLECYSTECTOMY     COLON SURGERY     RESECTION   IMPLANTATION VAGAL NERVE STIMULATOR     KNEE ARTHROSCOPY     OOPHORECTOMY     SALIVARY GLAND SURGERY     SHOULDER ARTHROSCOPY     WRIST SURGERY       Social History   Tobacco Use   Smoking status: Former    Packs/day: 1.00    Years: 20.00    Pack years: 20.00    Types: Cigarettes    Quit date: 11/17/1994    Years since quitting: 26.2   Smokeless tobacco: Never  Vaping Use   Vaping Use: Never used  Substance Use Topics   Alcohol use: Yes    Comment: rare   Drug use: No       Family History  Problem Relation Age of Onset   Congenital heart disease Mother    Pancreatic cancer Father    Cancer Maternal Grandmother    Lung cancer Maternal Grandfather    Breast cancer Neg Hx      Allergies  Allergen Reactions   Depakote [Divalproex Sodium] Other (See Comments)    IV form caused burning in the arm and shoulder   Dilantin [Phenytoin] Other (See Comments)    IV form caused burning in the arm and shoulder    REVIEW OF SYSTEMS (Negative unless checked)   Constitutional: '[]'$ Weight loss  '[]'$ Fever  '[]'$ Chills Cardiac: '[]'$ Chest pain   '[]'$ Chest pressure   '[]'$ Palpitations   '[]'$ Shortness of breath when laying flat   '[]'$ Shortness of breath at rest   '[]'$ Shortness of breath with exertion. Vascular:  '[x]'$ Pain in legs with walking   '[x]'$ Pain in legs at rest   '[]'$ Pain in legs when laying flat   '[]'$ Claudication   '[]'$ Pain in feet when walking  '[]'$ Pain in feet at rest  '[]'$ Pain in feet when laying flat   '[]'$ History of DVT   '[]'$ Phlebitis   '[x]'$ Swelling in legs   '[x]'$ Varicose veins   '[]'$ Non-healing ulcers Pulmonary:   '[]'$ Uses home oxygen   '[]'$ Productive cough   '[]'$ Hemoptysis   '[]'$ Wheeze  '[]'$ COPD   '[]'$ Asthma Neurologic:  '[]'$ Dizziness  '[]'$ Blackouts   '[x]'$ Seizures   '[]'$ History of stroke   '[]'$ History of TIA  '[]'$ Aphasia   '[]'$ Temporary blindness   '[]'$ Dysphagia   '[]'$ Weakness or numbness in arms   '[]'$ Weakness or numbness in legs Musculoskeletal:  '[]'$ Arthritis   '[]'$ Joint swelling   '[]'$ Joint pain   '[]'$ Low back pain Hematologic:  '[]'$ Easy bruising  '[]'$ Easy bleeding   '[]'$ Hypercoagulable state   '[x]'$ Anemic  '[]'$ Hepatitis Gastrointestinal:  '[]'$ Blood in stool   '[]'$ Vomiting blood  '[x]'$ Gastroesophageal reflux/heartburn   '[]'$ Abdominal pain Genitourinary:  '[x]'$ Chronic kidney disease   '[]'$ Difficult urination  '[]'$ Frequent urination  '[]'$ Burning with urination   '[]'$ Hematuria Skin:  '[]'$ Rashes   '[]'$ Ulcers   '[]'$ Wounds Psychological:  '[]'$ History of anxiety   '[]'$  History of major depression.  Physical Examination  BP (!) 152/83 (BP Location: Right Arm)   Pulse  80   Resp 16   Wt 176 lb 6.4 oz (80 kg)   BMI 28.47 kg/m  Gen:  WD/WN, NAD. Appears younger than stated age. Head: [place]/AT, No temporalis wasting. Ear/Nose/Throat: Hearing grossly intact, nares w/o erythema or drainage Eyes: Conjunctiva clear. Sclera non-icteric Neck: Supple.  Trachea midline Pulmonary:  Good air movement, no use of accessory muscles.  Cardiac: RRR, no JVD Vascular:  Vessel Right Left  Radial Palpable Palpable                          PT Palpable Palpable  DP Palpable Palpable   Gastrointestinal: soft, non-tender/non-distended. No guarding/reflex.  Musculoskeletal: M/S 5/5 throughout.  No deformity or atrophy. Trace BLE edema. Neurologic: Sensation grossly intact in extremities.  Symmetrical.  Speech is fluent.  Psychiatric: Judgment intact, Mood & affect appropriate for pt's clinical situation. Dermatologic: No rashes or ulcers noted.  No cellulitis or open wounds.      Labs No results found for this or any previous visit (from the past 2160 hour(s)).  Radiology No results found.  Assessment/Plan Mild renal insufficiency Can worsen lower extremity edema.  Varicose veins of leg with pain, bilateral Duplex today shows reflux in the right great saphenous vein and no reflux in the left leg.  No DVT or superficial thrombophlebitis was seen in either leg.  The patient's symptoms are roughly the same on each side, and we discussed that laser ablation could be offered on the right leg but it is hard to predict how much improvement she will see as her symptoms are roughly equal bilaterally.  I would still expect some improvement in pain and swelling, but clearly would not be complete.  There is definitely a component of lymphedema as well.  At this point, we discussed laser ablation but the patient would prefer to wait and continue conservative measures.  She is going to do this for 6 months and we will see her back at that time.  I am going to repeat a duplex as  her left leg may be borderline for reflux and we can recheck this next year.    Leotis Pain, MD  01/29/2021 1:24 PM    This note was created with Dragon medical transcription system.  Any errors from dictation are purely unintentional

## 2021-02-25 ENCOUNTER — Encounter: Payer: Self-pay | Admitting: Oncology

## 2021-04-10 ENCOUNTER — Other Ambulatory Visit (HOSPITAL_COMMUNITY): Payer: Self-pay | Admitting: Orthopedic Surgery

## 2021-04-10 ENCOUNTER — Other Ambulatory Visit: Payer: Self-pay | Admitting: Orthopedic Surgery

## 2021-04-11 ENCOUNTER — Other Ambulatory Visit (HOSPITAL_BASED_OUTPATIENT_CLINIC_OR_DEPARTMENT_OTHER): Payer: Self-pay | Admitting: Orthopedic Surgery

## 2021-04-11 ENCOUNTER — Other Ambulatory Visit: Payer: Self-pay | Admitting: Orthopedic Surgery

## 2021-04-11 DIAGNOSIS — M25562 Pain in left knee: Secondary | ICD-10-CM

## 2021-04-16 ENCOUNTER — Other Ambulatory Visit: Payer: Self-pay | Admitting: Orthopedic Surgery

## 2021-04-16 ENCOUNTER — Other Ambulatory Visit (HOSPITAL_COMMUNITY): Payer: Self-pay | Admitting: Orthopedic Surgery

## 2021-04-16 DIAGNOSIS — M25562 Pain in left knee: Secondary | ICD-10-CM

## 2021-04-26 ENCOUNTER — Encounter: Payer: Self-pay | Admitting: Oncology

## 2021-04-30 ENCOUNTER — Other Ambulatory Visit (HOSPITAL_COMMUNITY): Payer: Self-pay | Admitting: Orthopedic Surgery

## 2021-04-30 ENCOUNTER — Encounter: Payer: Self-pay | Admitting: Oncology

## 2021-04-30 DIAGNOSIS — M2392 Unspecified internal derangement of left knee: Secondary | ICD-10-CM

## 2021-05-03 ENCOUNTER — Ambulatory Visit (HOSPITAL_COMMUNITY): Admission: RE | Admit: 2021-05-03 | Payer: 59 | Source: Ambulatory Visit

## 2021-05-03 ENCOUNTER — Encounter (HOSPITAL_COMMUNITY): Payer: Self-pay

## 2021-07-01 ENCOUNTER — Other Ambulatory Visit: Payer: Self-pay | Admitting: Obstetrics and Gynecology

## 2021-07-01 DIAGNOSIS — Z1231 Encounter for screening mammogram for malignant neoplasm of breast: Secondary | ICD-10-CM

## 2021-07-30 ENCOUNTER — Encounter (INDEPENDENT_AMBULATORY_CARE_PROVIDER_SITE_OTHER): Payer: BLUE CROSS/BLUE SHIELD

## 2021-07-30 ENCOUNTER — Ambulatory Visit (INDEPENDENT_AMBULATORY_CARE_PROVIDER_SITE_OTHER): Payer: BLUE CROSS/BLUE SHIELD | Admitting: Vascular Surgery

## 2021-08-07 ENCOUNTER — Other Ambulatory Visit: Payer: Self-pay

## 2021-08-07 ENCOUNTER — Ambulatory Visit
Admission: RE | Admit: 2021-08-07 | Discharge: 2021-08-07 | Disposition: A | Discharge status: Home / Self Care | Payer: 59 | Source: Ambulatory Visit | Attending: Obstetrics and Gynecology | Admitting: Obstetrics and Gynecology

## 2021-08-07 DIAGNOSIS — Z1231 Encounter for screening mammogram for malignant neoplasm of breast: Secondary | ICD-10-CM | POA: Diagnosis not present

## 2021-08-07 IMAGING — MG MM DIGITAL SCREENING BILAT W/ TOMO AND CAD
6 of 10 series · 6 of 30 positions shown · non-contrast
Comparison: Previous exam(s).

CLINICAL DATA: Screening.

EXAM:
DIGITAL SCREENING BILATERAL MAMMOGRAM WITH TOMOSYNTHESIS AND CAD
TECHNIQUE: Bilateral screening digital craniocaudal and mediolateral oblique
mammograms were obtained. Bilateral screening digital breast
tomosynthesis was performed. The images were evaluated with
computer-aided detection.

[L MLO synth-2D]
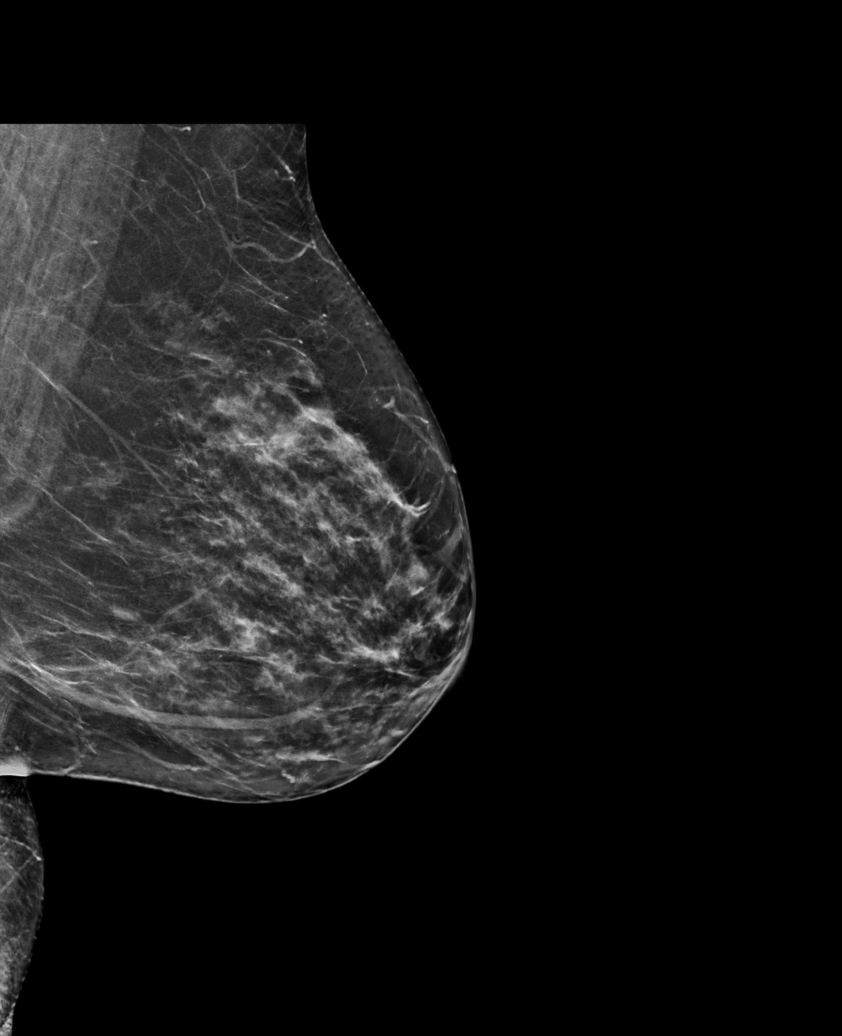

[L XCCM synth-2D]
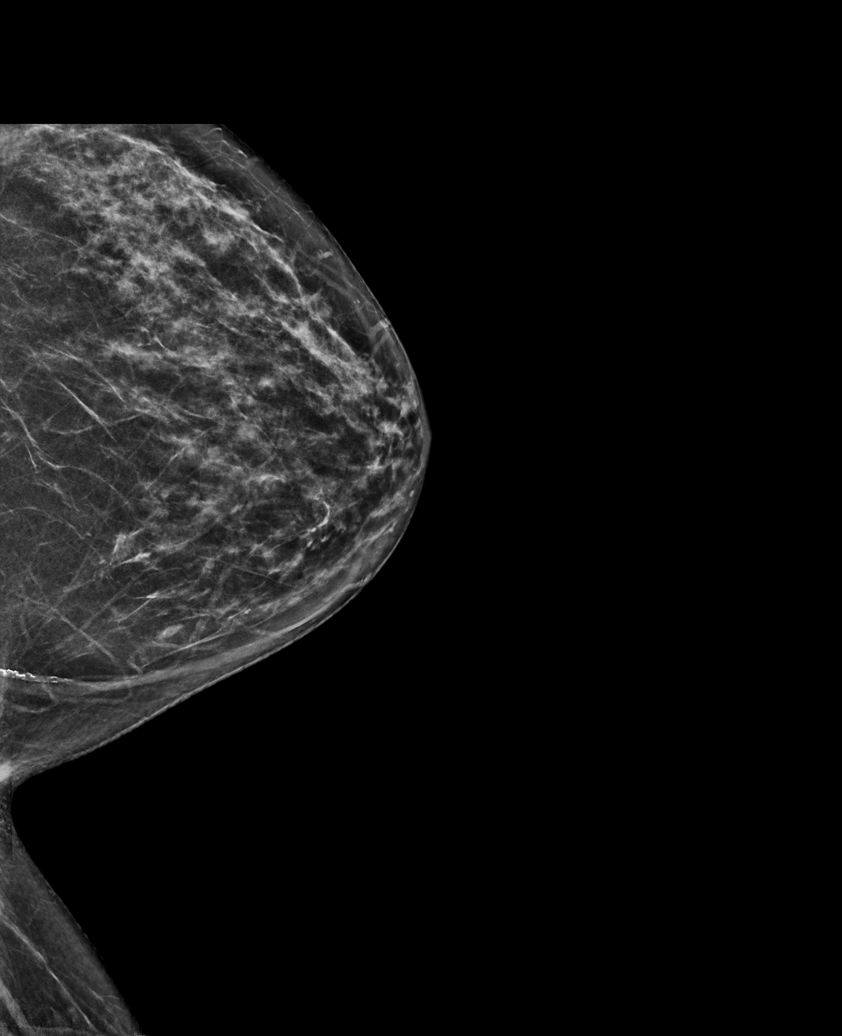

[R CC synth-2D]
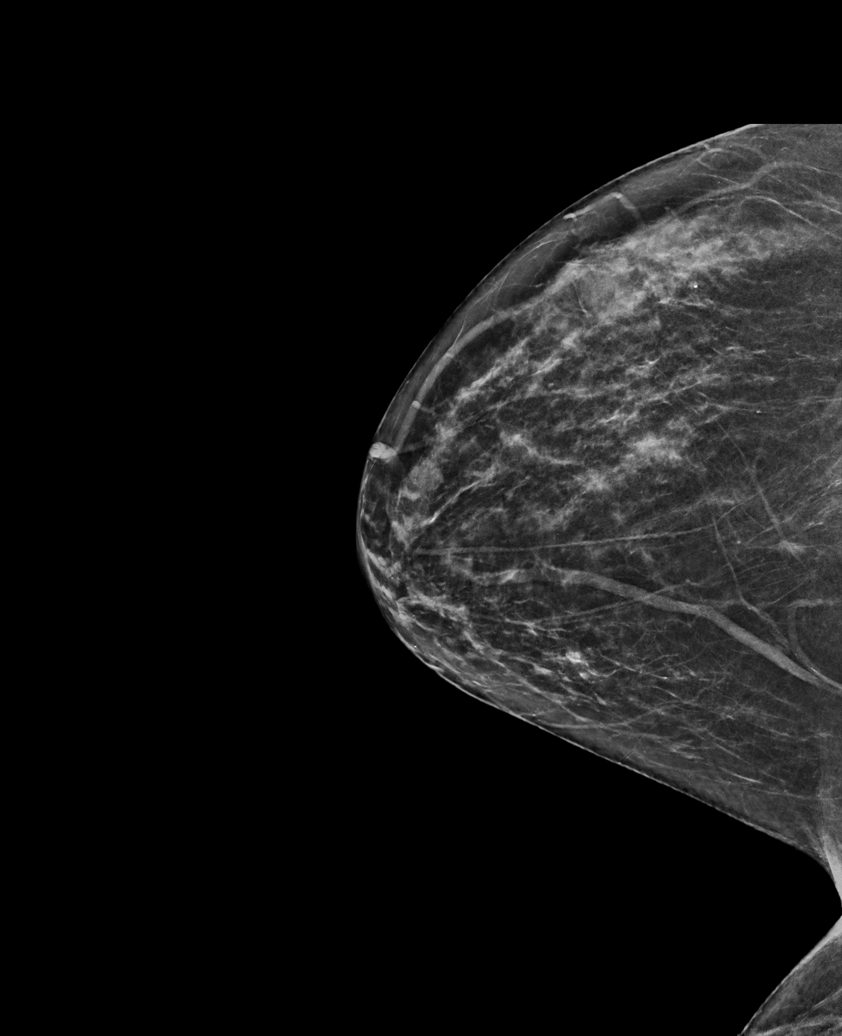

[L CC synth-2D]
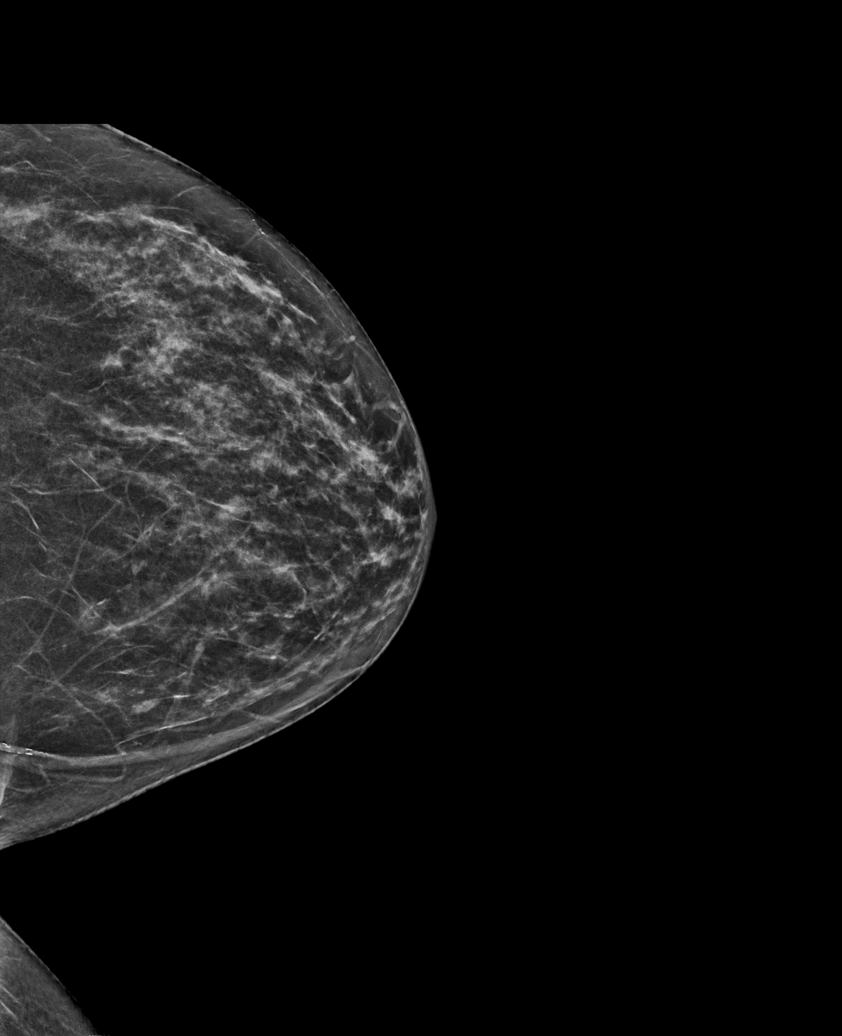

[R MLO synth-2D]
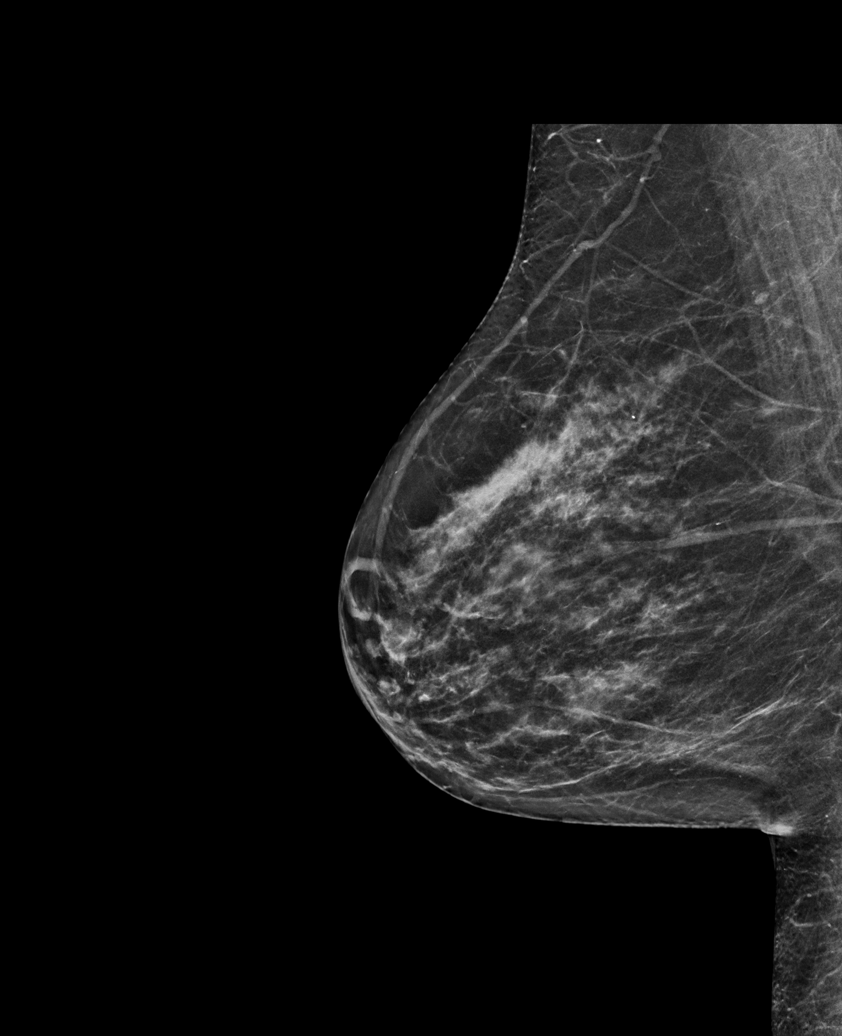

[R CC tomo · tomo slice 30/59.0]
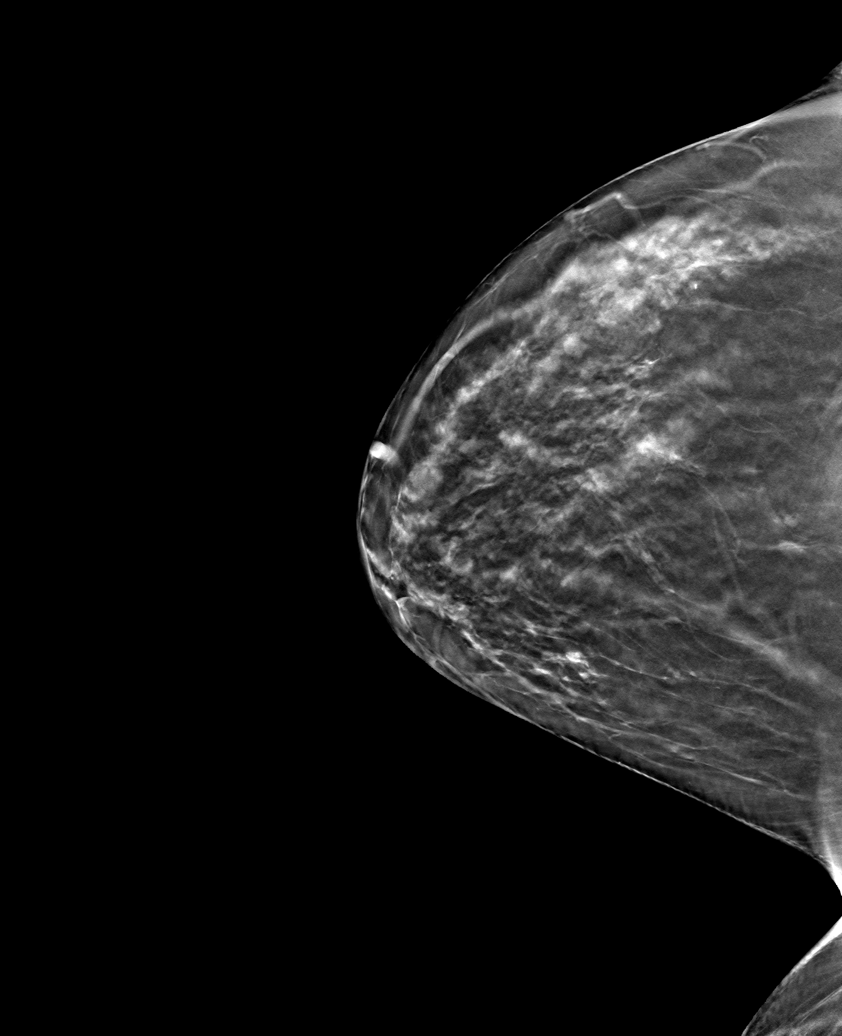

[6 of 30 positions shown; findings below may reference images not displayed]

ACR Breast Density Category c: The breast tissue is heterogeneously
dense, which may obscure small masses.
FINDINGS: In the right breast an asymmetry requires further evaluation.

In the left breast an asymmetry requires further evaluation.
IMPRESSION: Further evaluation is suggested for possible asymmetry in the right
breast.

Further evaluation is suggested for possible asymmetry in the left
breast.

RECOMMENDATION:
Diagnostic mammogram and possibly ultrasound of both breasts.
(Code:[PA])

The patient will be contacted regarding the findings, and additional
imaging will be scheduled.

BI-RADS CATEGORY  0: Incomplete. Need additional imaging evaluation
and/or prior mammograms for comparison.

## 2021-08-12 ENCOUNTER — Other Ambulatory Visit: Payer: Self-pay | Admitting: Obstetrics and Gynecology

## 2021-08-12 DIAGNOSIS — R928 Other abnormal and inconclusive findings on diagnostic imaging of breast: Secondary | ICD-10-CM

## 2021-08-12 DIAGNOSIS — N6489 Other specified disorders of breast: Secondary | ICD-10-CM

## 2021-08-22 ENCOUNTER — Other Ambulatory Visit: Payer: Self-pay | Admitting: Neurology

## 2021-08-22 ENCOUNTER — Other Ambulatory Visit (HOSPITAL_COMMUNITY): Payer: Self-pay | Admitting: Neurology

## 2021-08-22 DIAGNOSIS — R4189 Other symptoms and signs involving cognitive functions and awareness: Secondary | ICD-10-CM

## 2021-08-27 ENCOUNTER — Other Ambulatory Visit: Payer: Self-pay

## 2021-08-27 ENCOUNTER — Ambulatory Visit
Admission: RE | Admit: 2021-08-27 | Discharge: 2021-08-27 | Disposition: A | Discharge status: Home / Self Care | Payer: 59 | Source: Ambulatory Visit | Attending: Obstetrics and Gynecology | Admitting: Obstetrics and Gynecology

## 2021-08-27 DIAGNOSIS — N6489 Other specified disorders of breast: Secondary | ICD-10-CM

## 2021-08-27 DIAGNOSIS — R928 Other abnormal and inconclusive findings on diagnostic imaging of breast: Secondary | ICD-10-CM

## 2021-08-27 IMAGING — MG DIGITAL DIAGNOSTIC BILAT W/ TOMO W/ CAD
6 of 12 series · 6 of 36 positions shown · non-contrast
Comparison: Previous exam(s).

CLINICAL DATA: 65-year-old female for further evaluation of
possible bilateral asymmetries on screening mammogram.

EXAM:
DIGITAL DIAGNOSTIC BILATERAL MAMMOGRAM WITH TOMOSYNTHESIS AND CAD
TECHNIQUE: Bilateral digital diagnostic mammography and breast tomosynthesis
was performed. The images were evaluated with computer-aided
detection.

[R MLO synth-2D (1 of 2)]
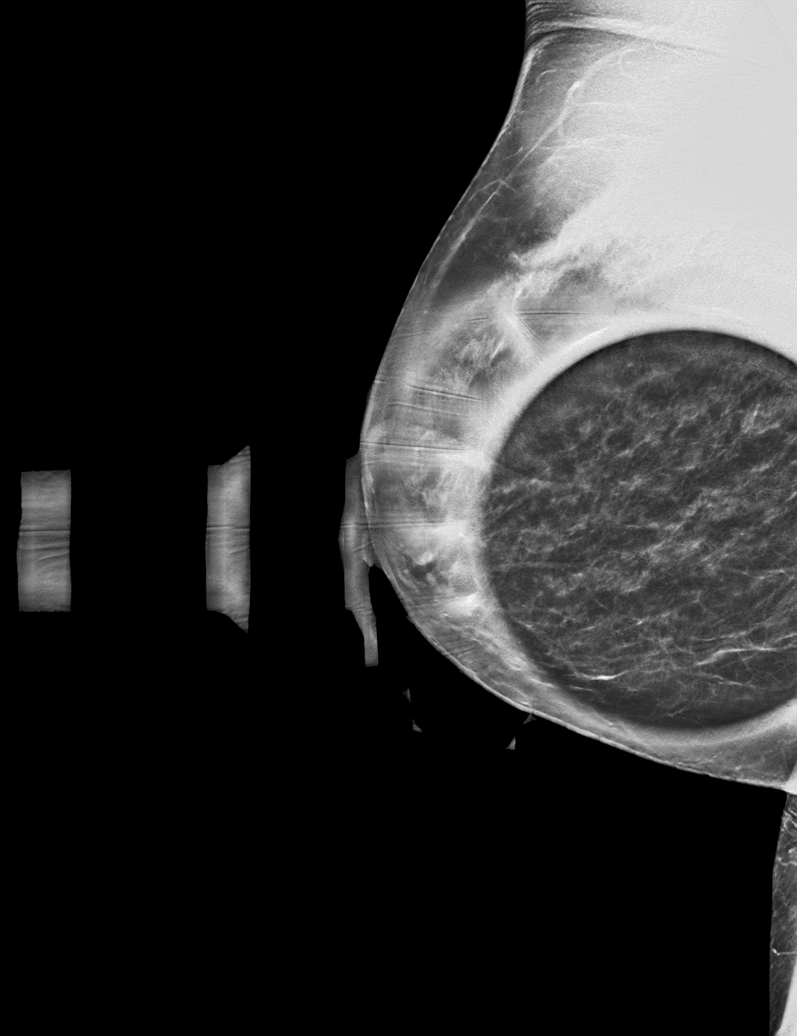

[L MLO synth-2D]
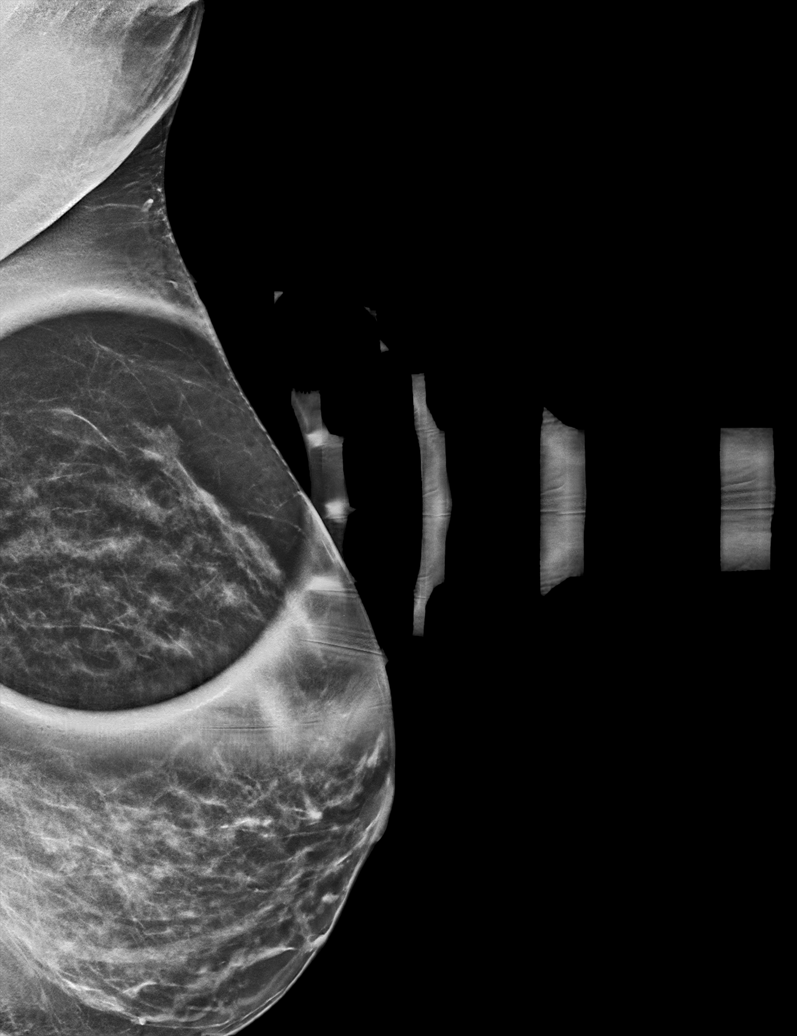

[L ML synth-2D (1 of 2)]
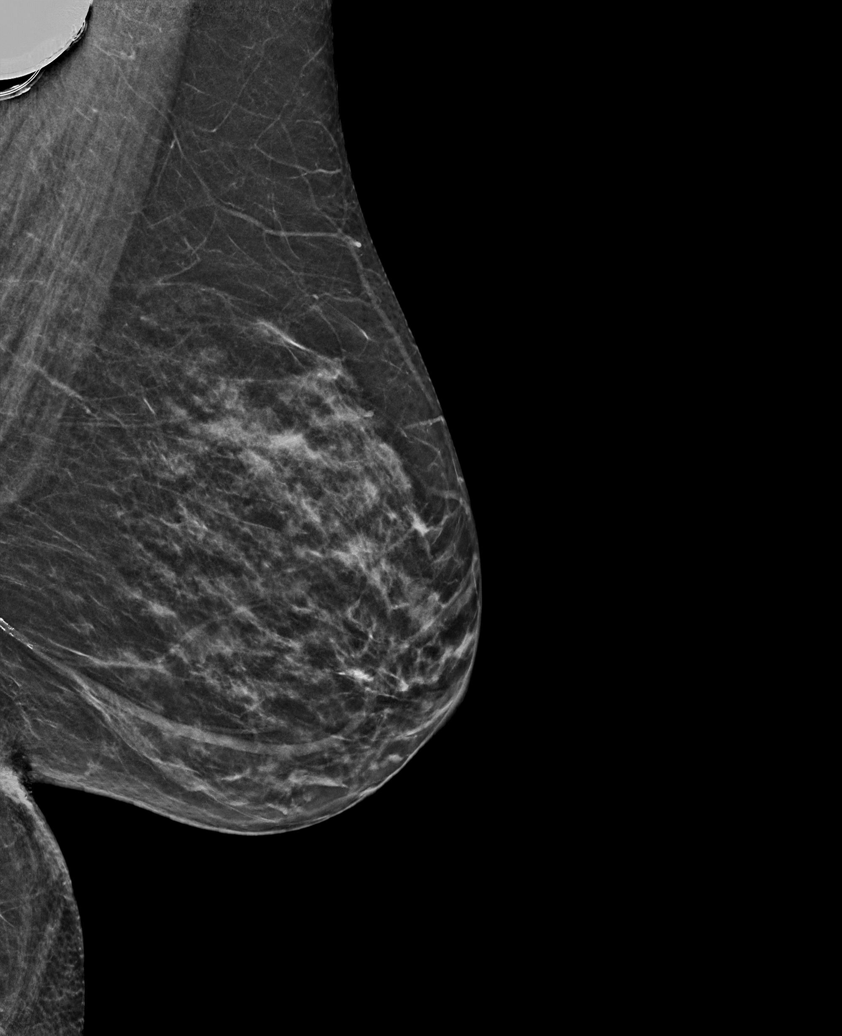

[R ML synth-2D]
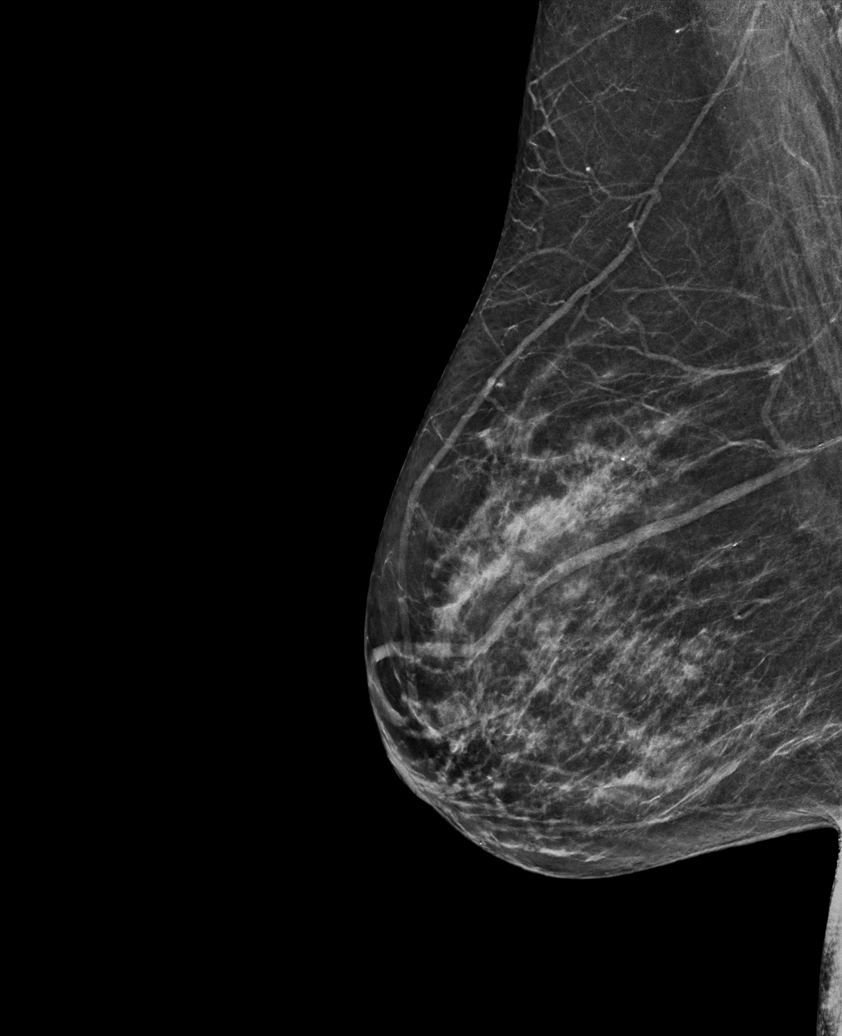

[L ML synth-2D (2 of 2)]
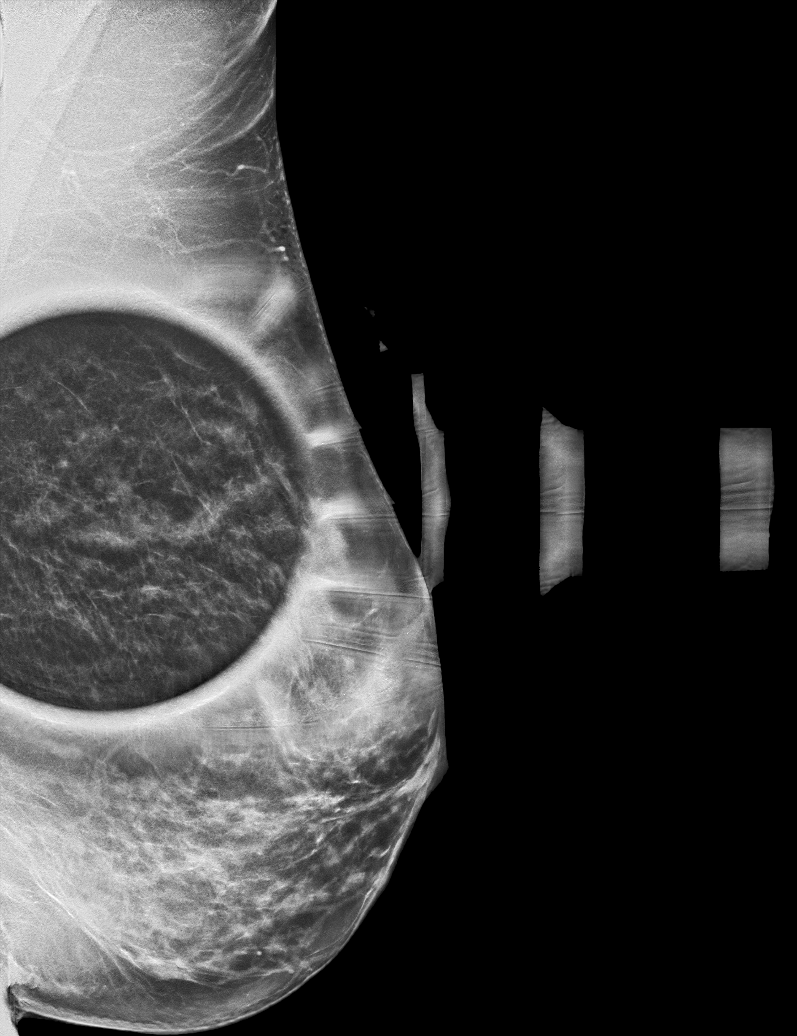

[R MLO synth-2D (2 of 2)]
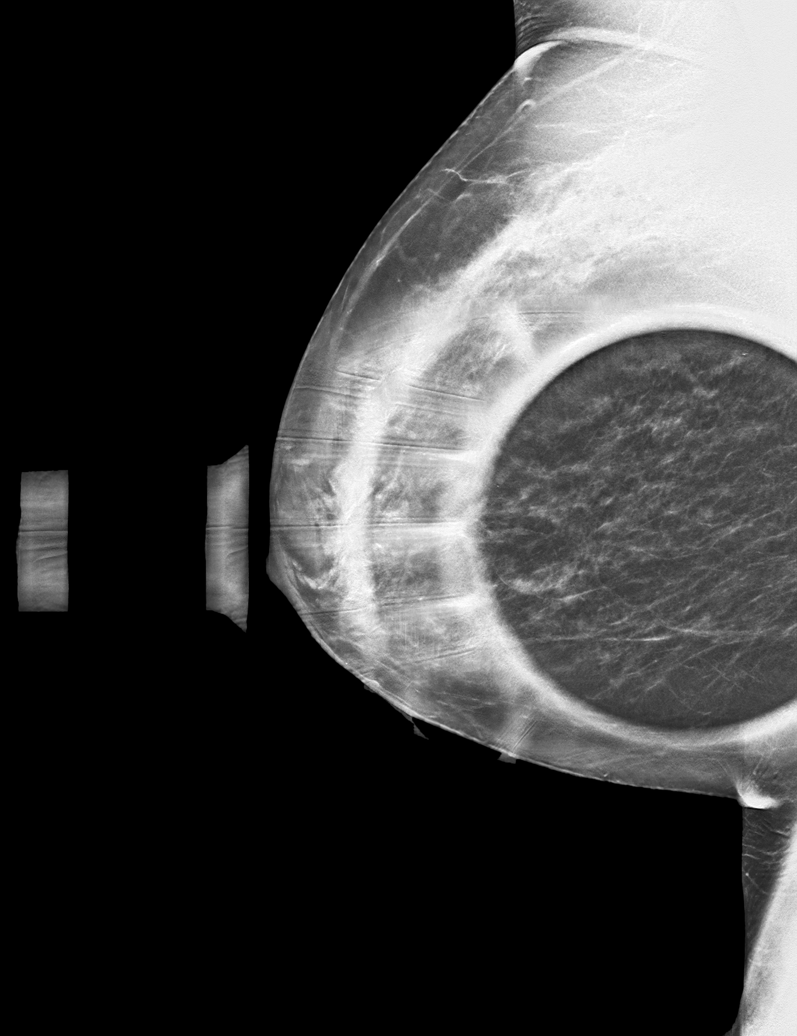

[6 of 36 positions shown; findings below may reference images not displayed]

ACR Breast Density Category b: There are scattered areas of
fibroglandular density.
FINDINGS: Full field and spot compression views of both breast demonstrate
dispersal of the bilateral screening study asymmetries without
persistent abnormalities in these areas.
IMPRESSION: No persistent abnormalities in the areas of the screening study
asymmetries bilaterally.

RECOMMENDATION:
Bilateral screening mammogram in 1 year.

I have discussed the findings and recommendations with the patient.
If applicable, a reminder letter will be sent to the patient
regarding the next appointment.

BI-RADS CATEGORY  1: Negative.

## 2021-09-03 ENCOUNTER — Encounter (INDEPENDENT_AMBULATORY_CARE_PROVIDER_SITE_OTHER): Payer: Self-pay | Admitting: Vascular Surgery

## 2021-09-03 ENCOUNTER — Other Ambulatory Visit: Payer: Self-pay

## 2021-09-03 ENCOUNTER — Ambulatory Visit (INDEPENDENT_AMBULATORY_CARE_PROVIDER_SITE_OTHER): Payer: 59 | Admitting: Vascular Surgery

## 2021-09-03 ENCOUNTER — Ambulatory Visit (INDEPENDENT_AMBULATORY_CARE_PROVIDER_SITE_OTHER): Payer: 59

## 2021-09-03 VITALS — BP 139/79 | HR 64 | Resp 17 | Ht 66.0 in | Wt 167.6 lb

## 2021-09-03 DIAGNOSIS — N289 Disorder of kidney and ureter, unspecified: Secondary | ICD-10-CM | POA: Diagnosis not present

## 2021-09-03 DIAGNOSIS — I83813 Varicose veins of bilateral lower extremities with pain: Secondary | ICD-10-CM

## 2021-09-03 NOTE — Progress Notes (Signed)
? ? ?MRN : [id number] ? ?Angela Delacruz is a 66 y.o. (May 13, 1956) female who presents with chief complaint of No chief complaint on file. ?. ? ?History of Present Illness: Patient returns today in follow up of leg swelling and venous insufficiency.  Her symptoms are roughly the same as when I saw her last 6 months ago.  Not significantly better or worse.  The legs are nearly symmetric in their symptomatology of swelling, pins and needle sensation, and heaviness in the legs.  No new ulceration or infection.  No fevers or chills.  Duplex today shows stable reflux in the right great saphenous vein and only reflux at the saphenofemoral junction on the left.  No DVT or superficial thrombophlebitis. ? ?Current Outpatient Medications  ?Medication Sig Dispense Refill  ? amitriptyline (ELAVIL) 25 MG tablet Take 25 mg by mouth at bedtime.  (Patient not taking: No sig reported)    ? aspirin EC 81 MG tablet Take 81 mg by mouth daily. (Patient not taking: No sig reported)    ? busPIRone (BUSPAR) 10 MG tablet Take 10 mg by mouth 2 (two) times daily. (Patient not taking: No sig reported)  3  ? calcium carbonate (OSCAL) 1500 (600 Ca) MG TABS tablet Take 600 mg of elemental calcium by mouth 2 (two) times daily with a meal. (Patient not taking: No sig reported)    ? cholecalciferol (D-VI-SOL) 400 UNIT/ML LIQD Take 400 Units by mouth 2 (two) times daily. (Patient not taking: No sig reported)    ? estradiol (VIVELLE-DOT) 0.025 MG/24HR APPLY 1 PATCH TOPICALLY TWICE A WEEK    ? lamoTRIgine (LAMICTAL) 200 MG tablet 2 (two) times daily.    ? lamoTRIgine (LAMICTAL) 25 MG tablet TAKE 1 TABLET BY MOUTH IN THE MORNING FOR 14 DAYS THEN 1 TABLET TWICE DAILY FOR 14 DAYS THEN 2 TABLET IN THE MORNING AND 1 TABLET NIGHTLY FO (Patient not taking: No sig reported)    ? levETIRAcetam (KEPPRA) 750 MG tablet Take 750 mg by mouth 2 (two) times daily. (Patient not taking: No sig reported)    ? levothyroxine (SYNTHROID, LEVOTHROID) 50 MCG tablet Take 50 mcg by  mouth daily before breakfast. (Patient not taking: No sig reported)    ? NP THYROID 30 MG tablet Take 30 mg by mouth daily.    ? omeprazole (PRILOSEC) 40 MG capsule Take 40 mg by mouth 2 (two) times daily.    ? OVER THE COUNTER MEDICATION Take 2 capsules by mouth daily. Truvision multivitamin (Patient not taking: No sig reported)    ? progesterone (PROMETRIUM) 100 MG capsule progesterone micronized 100 mg capsule    ? ?No current facility-administered medications for this visit.  ? ? ?Past Medical History:  ?Diagnosis Date  ? Complication of anesthesia   ? GERD (gastroesophageal reflux disease)   ? History of hiatal hernia   ? Hypothyroidism   ? Iron deficiency 11/17/2019  ? PONV (postoperative nausea and vomiting)   ? Seizures ([place])   ? EPILEPSY  ? ? ?Past Surgical History:  ?Procedure Laterality Date  ? APPENDECTOMY    ? [place]  ? explanted 2015  ? BREAST IMPLANT REMOVAL Bilateral 2015  ? CATARACT EXTRACTION W/PHACO Right 06/03/2016  ? Procedure: CATARACT EXTRACTION PHACO AND INTRAOCULAR LENS PLACEMENT (I[facility]);  Surgeon: Birder Robson, MD;  Location: [facility];  Service: Ophthalmology;  Laterality: Right;  LOT PACK: [id number] ?US:01:02 ?AP:52.8 ?CDE:13.78  ? CATARACT EXTRACTION W/PHACO Left 09/02/2016  ? Procedure: CATARACT EXTRACTION PHACO AND INTRAOCULAR  LENS PLACEMENT ([facility]);  Surgeon: Birder Robson, MD;  Location: [facility];  Service: Ophthalmology;  Laterality: Left;  [place] 54.6 ?AP% 16.8 ?CDE 9.17 ?Fluid pack lot # [id number]  ? CHOLECYSTECTOMY    ? COLON SURGERY    ? RESECTION  ? IMPLANTATION VAGAL NERVE STIMULATOR    ? KNEE ARTHROSCOPY    ? OOPHORECTOMY    ? SALIVARY GLAND SURGERY    ? SHOULDER ARTHROSCOPY    ? WRIST SURGERY    ? ? ? ?Social History  ? ?Tobacco Use  ? Smoking status: Former  ?  Packs/day: 1.00  ?  Years: 20.00  ?  Pack years: 20.00  ?  Types: Cigarettes  ?  Quit date: 11/17/1994  ?  Years since quitting: 26.8  ? Smokeless tobacco: Never  ?Vaping Use  ? Vaping Use: Never used   ?Substance Use Topics  ? Alcohol use: Yes  ?  Comment: rare  ? Drug use: No  ? ? ? ? ?Family History  ?Problem Relation Age of Onset  ? Congenital heart disease Mother   ? Pancreatic cancer Father   ? Cancer Maternal Grandmother   ? Lung cancer Maternal Grandfather   ? Breast cancer Neg Hx   ? ? ? ?Allergies  ?Allergen Reactions  ? Depakote [Divalproex Sodium] Other (See Comments)  ?  IV form caused burning in the arm and shoulder  ? Dilantin [Phenytoin] Other (See Comments)  ?  IV form caused burning in the arm and shoulder  ? ? ?REVIEW OF SYSTEMS (Negative unless checked) ?  ?Constitutional: '[]'$ Weight loss  '[]'$ Fever  '[]'$ Chills ?Cardiac: '[]'$ Chest pain   '[]'$ Chest pressure   '[]'$ Palpitations   '[]'$ Shortness of breath when laying flat   '[]'$ Shortness of breath at rest   '[]'$ Shortness of breath with exertion. ?Vascular:  '[x]'$ Pain in legs with walking   '[x]'$ Pain in legs at rest   '[]'$ Pain in legs when laying flat   '[]'$ Claudication   '[]'$ Pain in feet when walking  '[]'$ Pain in feet at rest  '[]'$ Pain in feet when laying flat   '[]'$ History of DVT   '[]'$ Phlebitis   '[x]'$ Swelling in legs   '[x]'$ Varicose veins   '[]'$ Non-healing ulcers ?Pulmonary:   '[]'$ Uses home oxygen   '[]'$ Productive cough   '[]'$ Hemoptysis   '[]'$ Wheeze  '[]'$ COPD   '[]'$ Asthma ?Neurologic:  '[]'$ Dizziness  '[]'$ Blackouts   '[x]'$ Seizures   '[]'$ History of stroke   '[]'$ History of TIA  '[]'$ Aphasia   '[]'$ Temporary blindness   '[]'$ Dysphagia   '[]'$ Weakness or numbness in arms   '[]'$ Weakness or numbness in legs ?Musculoskeletal:  '[]'$ Arthritis   '[]'$ Joint swelling   '[]'$ Joint pain   '[]'$ Low back pain ?Hematologic:  '[]'$ Easy bruising  '[]'$ Easy bleeding   '[]'$ Hypercoagulable state   '[x]'$ Anemic  '[]'$ Hepatitis ?Gastrointestinal:  '[]'$ Blood in stool   '[]'$ Vomiting blood  '[x]'$ Gastroesophageal reflux/heartburn   '[]'$ Abdominal pain ?Genitourinary:  '[x]'$ Chronic kidney disease   '[]'$ Difficult urination  '[]'$ Frequent urination  '[]'$ Burning with urination   '[]'$ Hematuria ?Skin:  '[]'$ Rashes   '[]'$ Ulcers   '[]'$ Wounds ?Psychological:  '[]'$ History of anxiety   '[]'$  History of major  depression. ? ?Physical Examination ? ?There were no vitals taken for this visit. ?Gen:  WD/WN, NAD ?Head: [place]/AT, No temporalis wasting. ?Ear/Nose/Throat: Hearing grossly intact, nares w/o erythema or drainage ?Eyes: Conjunctiva clear. Sclera non-icteric ?Neck: Supple.  Trachea midline ?Pulmonary:  Good air movement, no use of accessory muscles.  ?Cardiac: RRR, no JVD ?Vascular:  ?Vessel Right Left  ?Radial Palpable Palpable  ?    ?    ? ?Musculoskeletal:  M/S 5/5 throughout.  No deformity or atrophy.  Trace bilateral lower extremity edema. ?Neurologic: Sensation grossly intact in extremities.  Symmetrical.  Speech is fluent.  ?Psychiatric: Judgment intact, Mood & affect appropriate for pt's clinical situation. ?Dermatologic: No rashes or ulcers noted.  No cellulitis or open wounds. ? ? ? ? ? ?Labs ?No results found for this or any previous visit (from the past 2160 hour(s)). ? ?Radiology ?MM DIAG BREAST TOMO BILATERAL ? ?Result Date: 08/27/2021 ?CLINICAL DATA:  66 year old female for further evaluation of possible bilateral asymmetries on screening mammogram. EXAM: DIGITAL DIAGNOSTIC BILATERAL MAMMOGRAM WITH TOMOSYNTHESIS AND CAD TECHNIQUE: Bilateral digital diagnostic mammography and breast tomosynthesis was performed. The images were evaluated with computer-aided detection. COMPARISON:  Previous exam(s). ACR Breast Density Category b: There are scattered areas of fibroglandular density. FINDINGS: Full field and spot compression views of both breast demonstrate dispersal of the bilateral screening study asymmetries without persistent abnormalities in these areas. IMPRESSION: No persistent abnormalities in the areas of the screening study asymmetries bilaterally. RECOMMENDATION: Bilateral screening mammogram in 1 year. I have discussed the findings and recommendations with the patient. If applicable, a reminder letter will be sent to the patient regarding the next appointment. BI-RADS CATEGORY  1: Negative.  Electronically Signed   By: Margarette Canada M.D.   On: 08/27/2021 15:38 ? ?MM 3D SCREEN BREAST BILATERAL ? ?Result Date: 08/07/2021 ?CLINICAL DATA:  Screening. EXAM: DIGITAL SCREENING BILATERAL MAMMOGRAM WITH TOMOSYNTHESIS

## 2021-09-13 ENCOUNTER — Ambulatory Visit (HOSPITAL_COMMUNITY): Payer: 59

## 2021-09-18 ENCOUNTER — Ambulatory Visit (HOSPITAL_COMMUNITY)
Admission: RE | Admit: 2021-09-18 | Discharge: 2021-09-18 | Disposition: A | Discharge status: Home / Self Care | Payer: 59 | Source: Ambulatory Visit | Attending: Neurology | Admitting: Neurology

## 2021-09-18 DIAGNOSIS — R4189 Other symptoms and signs involving cognitive functions and awareness: Secondary | ICD-10-CM | POA: Insufficient documentation

## 2021-09-18 IMAGING — MR MR HEAD W/O CM
13 of 15 series · 44 of 48 positions shown · non-contrast
Comparison: Brain MRI [DATE]

CLINICAL DATA: Cognitive impairment

EXAM:
MRI HEAD WITHOUT CONTRAST
TECHNIQUE: Multiplanar, multiecho pulse sequences of the brain and surrounding
structures were obtained without intravenous contrast.
Additionally, using NeuroQuant software a 3D volumetric analysis of
the brain was performed and is compared to a normative database
adjusted for age, gender and intracranial volume.

[Series 3: T1 · sagittal · 1.2mm · 0.94mm/px · 2 of 154 slices shown]
[im 1/154]
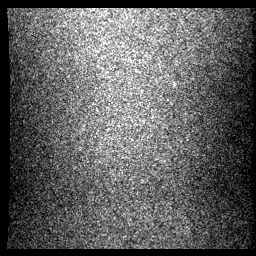
[im 154/154]
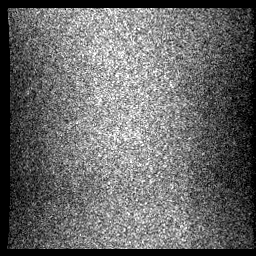

[Series 4: DWI · axial · 3.0mm · 1.09mm/px · z∈[-90,+75]mm · 2 of 112 slices shown (1 of 2)]
[im 1/112]
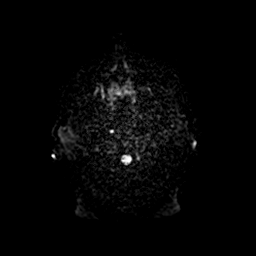
[im 112/112]
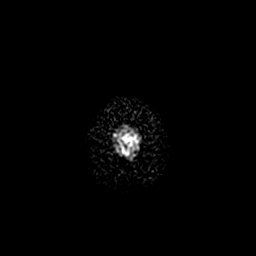

[Series 5: FLAIR · axial · 3.0mm · 0.43mm/px · 1 of 27 slices shown]
[im 1/27]
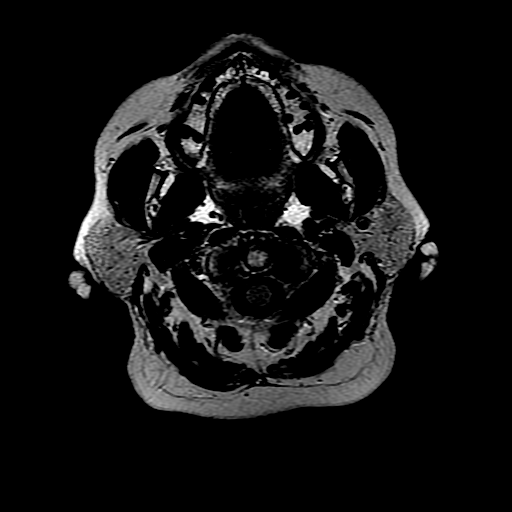

[Series 6: ax mpgr · axial · 5.0mm · 0.43mm/px · 1 of 23 slices shown]
[im 1/23]
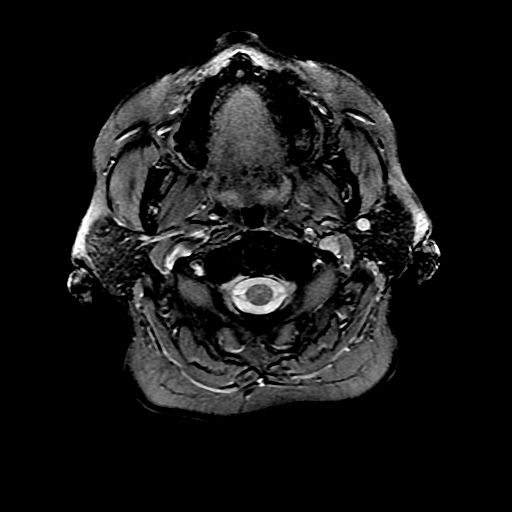

[Series 7: T2 · axial · 5.0mm · 0.43mm/px · 1 of 27 slices shown]
[im 1/27]
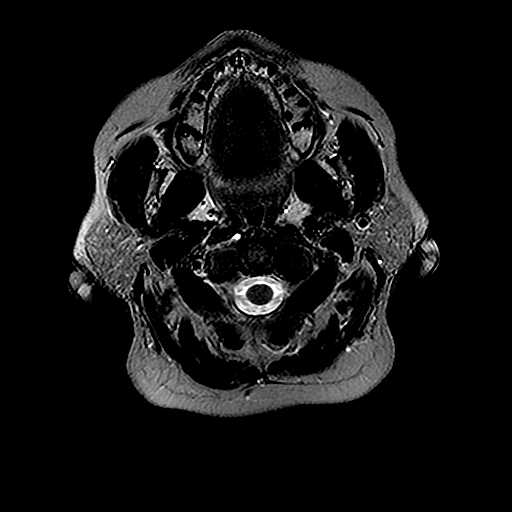

[Series 52: nqsegcorsc · 1.00mm/px · 6 of 256 slices shown (1 of 2)]
[im 1/256]
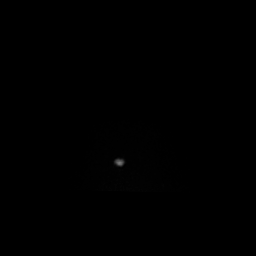
[im 52/256]
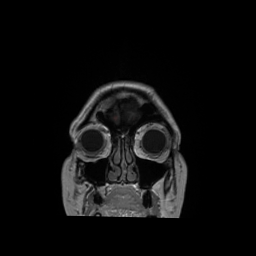
[im 103/256]
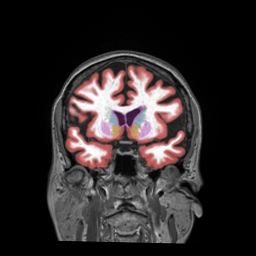
[im 154/256]
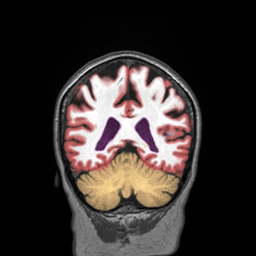
[im 205/256]
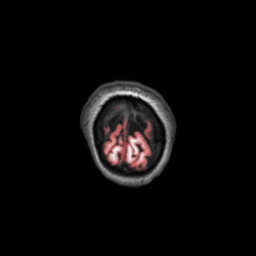
[im 256/256]
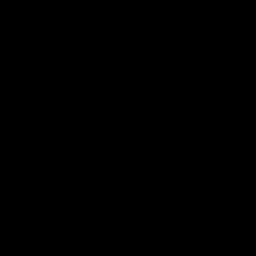

[Series 52: nqsegcorsc · 1.00mm/px · 6 of 256 slices shown (2 of 2)]
[im 1/256]
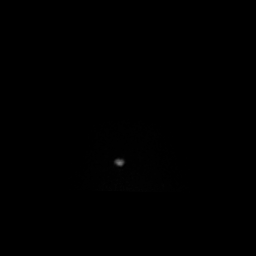
[im 52/256]
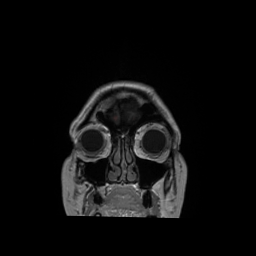
[im 103/256]
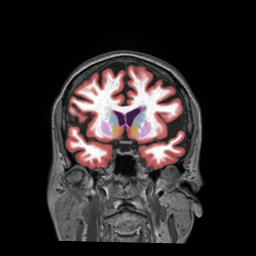
[im 154/256]
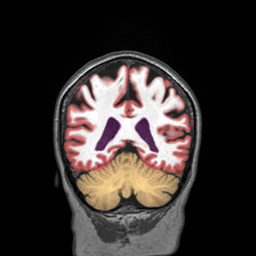
[im 205/256]
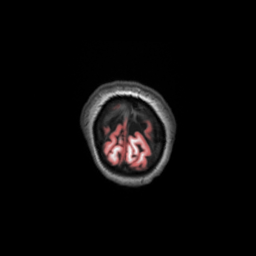
[im 256/256]
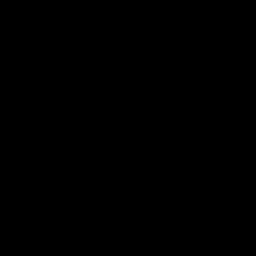

[Series 53: nqsegaxlsc · 1.00mm/px · 6 of 253 slices shown (1 of 2)]
[im 1/253]
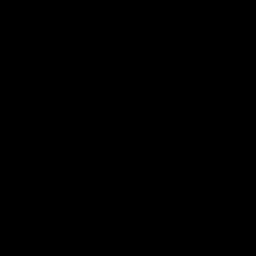
[im 51/253]
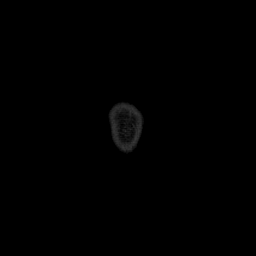
[im 101/253]
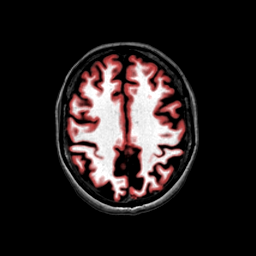
[im 152/253]
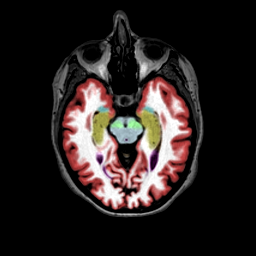
[im 202/253]
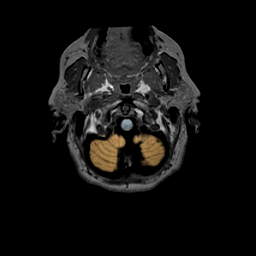
[im 253/253]
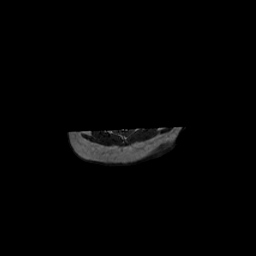

[Series 53: nqsegaxlsc · 1.00mm/px · 6 of 253 slices shown (2 of 2)]
[im 1/253]
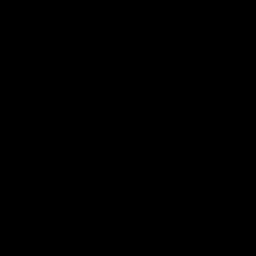
[im 51/253]
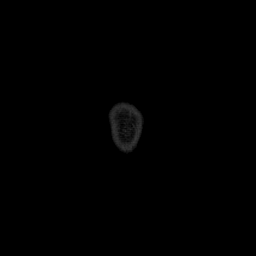
[im 101/253]
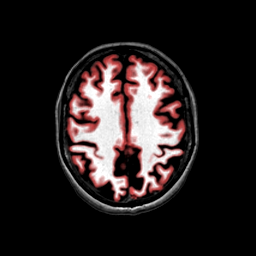
[im 152/253]
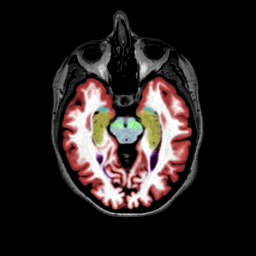
[im 202/253]
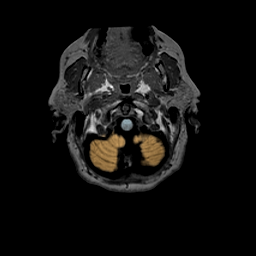
[im 253/253]
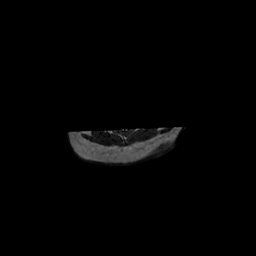

[Series 54: nqsegsagsc · 1.00mm/px · 4 of 192 slices shown (1 of 2)]
[im 1/192]
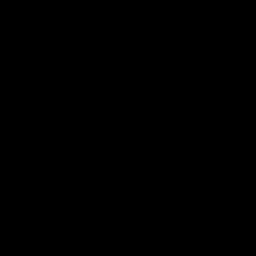
[im 64/192]
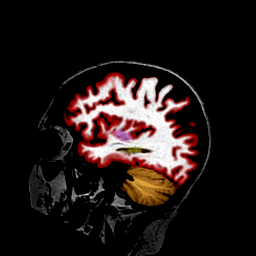
[im 128/192]
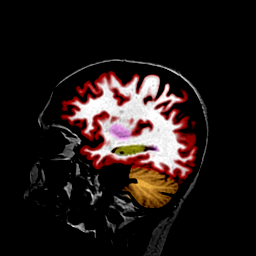
[im 192/192]
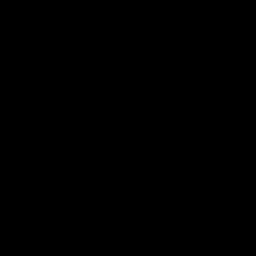

[Series 54: nqsegsagsc · 1.00mm/px · 4 of 192 slices shown (2 of 2)]
[im 1/192]
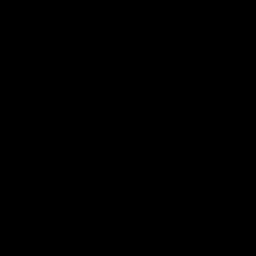
[im 64/192]
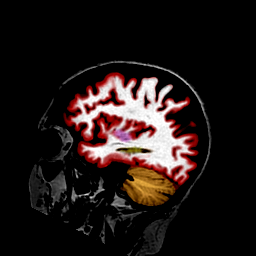
[im 128/192]
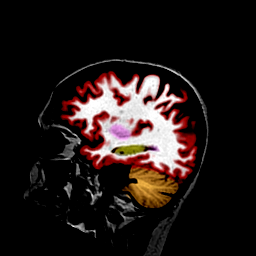
[im 192/192]
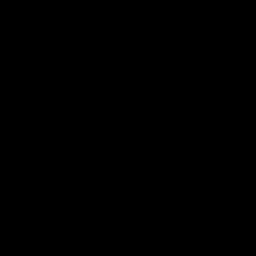

[Series 300: processed images · axial · 2.0mm · 0.43mm/px · z∈[-77,+129]mm · 4 of 174 slices shown]
[im 1/174]
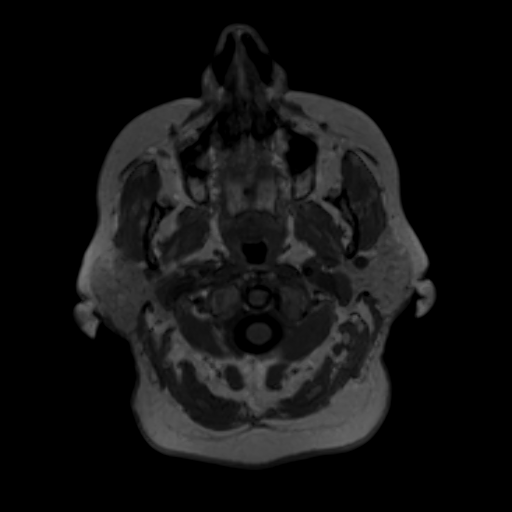
[im 58/174]
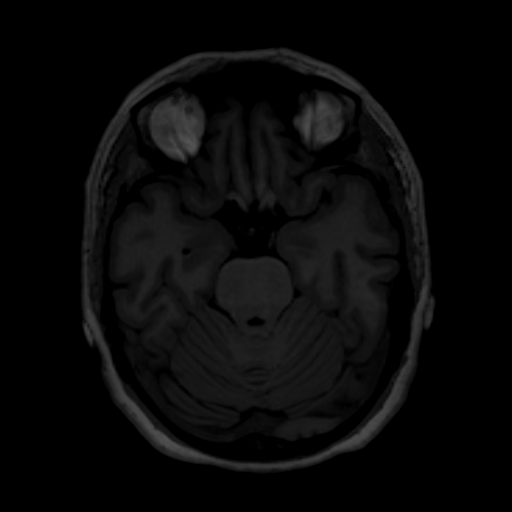
[im 116/174]
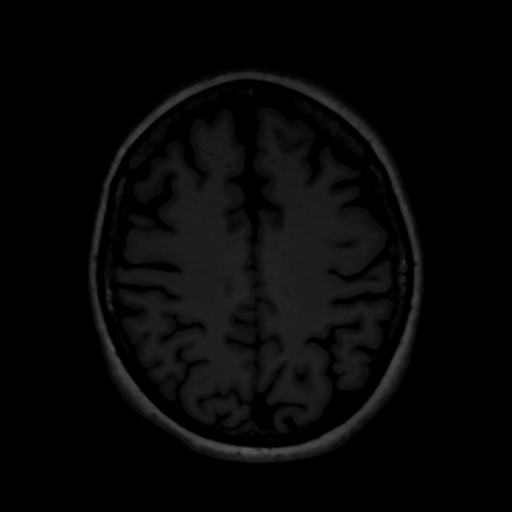
[im 174/174]
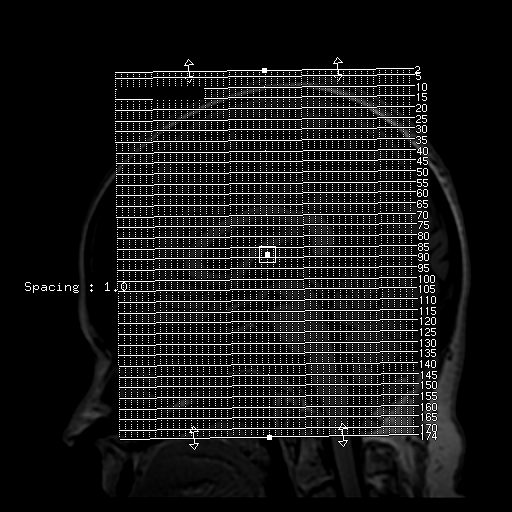

[Series 400: DWI · axial · 3.0mm · 1.09mm/px · 1 of 56 slices shown (2 of 2)]
[im 1/56]
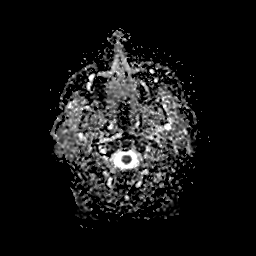

[44 of 48 positions shown; findings below may reference images not displayed]

FINDINGS: Brain: No acute infarct, mass effect or extra-axial collection. No
acute or chronic hemorrhage. There is multifocal periventricular
white matter hyperintensity, most often a result of chronic
microvascular ischemia. The midline structures are normal.

Vascular: Major flow voids are preserved.

Skull and upper cervical spine: Normal calvarium and skull base.
Visualized upper cervical spine and soft tissues are normal.

Sinuses/Orbits:No paranasal sinus fluid levels or advanced mucosal
thickening. No mastoid or middle ear effusion. Normal orbits.

NeuroQuant Findings:

Volumetric analysis of the brain was performed, with a fully
detailed report in [HOSPITAL] PACS. Briefly, the comparison with age and
gender matched reference reveals fall volumetric measurements within
normal limits. Mildly elevated white matter hyperintensities
relative to reference standards.
IMPRESSION: 1. No acute intracranial abnormality.
2. Mild findings of chronic microvascular ischemia.
3. NeuroQuant volumetric analysis of the brain, see details on
[HOSPITAL] PACS.

## 2021-12-11 ENCOUNTER — Telehealth (INDEPENDENT_AMBULATORY_CARE_PROVIDER_SITE_OTHER): Payer: Self-pay | Admitting: Vascular Surgery

## 2021-12-11 NOTE — Telephone Encounter (Signed)
Patient has a couple of veins in her left leg that has gotten a lot bigger and would like to see jd.  Please advise.

## 2021-12-11 NOTE — Telephone Encounter (Signed)
She can reschedule for a nonurgent visit with a reflux to see JD

## 2022-02-13 ENCOUNTER — Other Ambulatory Visit (INDEPENDENT_AMBULATORY_CARE_PROVIDER_SITE_OTHER): Payer: Self-pay | Admitting: Nurse Practitioner

## 2022-02-13 DIAGNOSIS — I8392 Asymptomatic varicose veins of left lower extremity: Secondary | ICD-10-CM

## 2022-02-14 ENCOUNTER — Encounter (INDEPENDENT_AMBULATORY_CARE_PROVIDER_SITE_OTHER): Payer: Self-pay | Admitting: Vascular Surgery

## 2022-02-14 ENCOUNTER — Ambulatory Visit (INDEPENDENT_AMBULATORY_CARE_PROVIDER_SITE_OTHER): Payer: 59

## 2022-02-14 ENCOUNTER — Ambulatory Visit (INDEPENDENT_AMBULATORY_CARE_PROVIDER_SITE_OTHER): Payer: 59 | Admitting: Vascular Surgery

## 2022-02-14 VITALS — BP 155/82 | HR 66 | Resp 18 | Ht 66.0 in | Wt 168.6 lb

## 2022-02-14 DIAGNOSIS — G2581 Restless legs syndrome: Secondary | ICD-10-CM

## 2022-02-14 DIAGNOSIS — I8392 Asymptomatic varicose veins of left lower extremity: Secondary | ICD-10-CM

## 2022-02-14 DIAGNOSIS — I83813 Varicose veins of bilateral lower extremities with pain: Secondary | ICD-10-CM | POA: Diagnosis not present

## 2022-02-14 NOTE — Assessment & Plan Note (Signed)
We repeated a left leg venous reflux study which shows only reflux in that area of varicosities in the left medial calf.  The rest of the great saphenous veins without reflux. I think foam sclerotherapy would be a reasonable option for the varicosities on the left calf.  The laser ablation would not be appropriate with only the short segment reflux in the calf.  We will submit that for insurance approval.  She does have long segment reflux on the right and has considered laser ablation in the past but given her bilateral symptoms with unilateral disease, we discussed that I am not sure how much improvement she will have.

## 2022-02-14 NOTE — Progress Notes (Signed)
MRN : [id number]  Angela Delacruz is a 66 y.o. ([birth year]) female who presents with chief complaint of No chief complaint on file. Marland Kitchen  History of Present Illness: Patient returns today in follow up of her leg pain and venous disease.  She says her restless leg symptoms have gotten really bad lately.  Both legs are affected by this.  She has a known history of venous reflux on the right, and she has noticed a large prominent varicosity developing in the left medial calf area over the past few months.  We repeated a left leg venous reflux study which shows only reflux in that area of varicosities in the left medial calf.  The rest of the great saphenous veins without reflux.  Current Outpatient Medications  Medication Sig Dispense Refill   estradiol (VIVELLE-DOT) 0.025 MG/24HR APPLY 1 PATCH TOPICALLY TWICE A WEEK     lamoTRIgine (LAMICTAL) 200 MG tablet 2 (two) times daily.     progesterone (PROMETRIUM) 100 MG capsule progesterone micronized 100 mg capsule     omeprazole (PRILOSEC) 40 MG capsule Take 40 mg by mouth 2 (two) times daily.     No current facility-administered medications for this visit.    Past Medical History:  Diagnosis Date   Complication of anesthesia    GERD (gastroesophageal reflux disease)    History of hiatal hernia    Hypothyroidism    Iron deficiency 11/17/2019   PONV (postoperative nausea and vomiting)    Seizures ([place])    EPILEPSY    Past Surgical History:  Procedure Laterality Date   APPENDECTOMY     AUGMENTATION MAMMAPLASTY  1996   explanted 2015   BREAST IMPLANT REMOVAL Bilateral 2015   CATARACT EXTRACTION W/PHACO Right 06/03/2016   Procedure: CATARACT EXTRACTION PHACO AND INTRAOCULAR LENS PLACEMENT ([place]);  Surgeon: Birder Robson, MD;  Location: [facility];  Service: Ophthalmology;  Laterality: Right;  LOT PACK: [id number] H US:01:02 AP:52.8 CDE:13.78   CATARACT EXTRACTION W/PHACO Left 09/02/2016   Procedure: CATARACT EXTRACTION PHACO AND INTRAOCULAR LENS  PLACEMENT (IOC);  Surgeon: Birder Robson, MD;  Location: [facility];  Service: Ophthalmology;  Laterality: Left;  [place] 54.6 AP% 16.8 CDE 9.17 Fluid pack lot # [id number]   CHOLECYSTECTOMY     COLON SURGERY     RESECTION   IMPLANTATION VAGAL NERVE STIMULATOR     KNEE ARTHROSCOPY     OOPHORECTOMY     SALIVARY GLAND SURGERY     SHOULDER ARTHROSCOPY     WRIST SURGERY       Social History   Tobacco Use   Smoking status: Former    Packs/day: 1.00    Years: 20.00    Total pack years: 20.00    Types: Cigarettes    Quit date: 11/17/1994    Years since quitting: 27.2   Smokeless tobacco: Never  Vaping Use   Vaping Use: Never used  Substance Use Topics   Alcohol use: Yes    Comment: rare   Drug use: No      Family History  Problem Relation Age of Onset   Congenital heart disease Mother    Pancreatic cancer Father    Cancer Maternal Grandmother    Lung cancer Maternal Grandfather    Breast cancer Neg Hx      Allergies  Allergen Reactions   Depakote [Divalproex Sodium] Other (See Comments)    IV form caused burning in the arm and shoulder   Dilantin [Phenytoin] Other (See Comments)    IV form caused  burning in the arm and shoulder    REVIEW OF SYSTEMS (Negative unless checked)   Constitutional: '[]'$ Weight loss  '[]'$ Fever  '[]'$ Chills Cardiac: '[]'$ Chest pain   '[]'$ Chest pressure   '[]'$ Palpitations   '[]'$ Shortness of breath when laying flat   '[]'$ Shortness of breath at rest   '[]'$ Shortness of breath with exertion. Vascular:  '[x]'$ Pain in legs with walking   '[x]'$ Pain in legs at rest   '[]'$ Pain in legs when laying flat   '[]'$ Claudication   '[]'$ Pain in feet when walking  '[]'$ Pain in feet at rest  '[]'$ Pain in feet when laying flat   '[]'$ History of DVT   '[]'$ Phlebitis   '[x]'$ Swelling in legs   '[x]'$ Varicose veins   '[]'$ Non-healing ulcers Pulmonary:   '[]'$ Uses home oxygen   '[]'$ Productive cough   '[]'$ Hemoptysis   '[]'$ Wheeze  '[]'$ COPD   '[]'$ Asthma Neurologic:  '[]'$ Dizziness  '[]'$ Blackouts   '[x]'$ Seizures   '[]'$ History of stroke   '[]'$ History of  TIA  '[]'$ Aphasia   '[]'$ Temporary blindness   '[]'$ Dysphagia   '[]'$ Weakness or numbness in arms   '[]'$ Weakness or numbness in legs Musculoskeletal:  '[]'$ Arthritis   '[]'$ Joint swelling   '[]'$ Joint pain   '[]'$ Low back pain Hematologic:  '[]'$ Easy bruising  '[]'$ Easy bleeding   '[]'$ Hypercoagulable state   '[x]'$ Anemic  '[]'$ Hepatitis Gastrointestinal:  '[]'$ Blood in stool   '[]'$ Vomiting blood  '[x]'$ Gastroesophageal reflux/heartburn   '[]'$ Abdominal pain Genitourinary:  '[x]'$ Chronic kidney disease   '[]'$ Difficult urination  '[]'$ Frequent urination  '[]'$ Burning with urination   '[]'$ Hematuria Skin:  '[]'$ Rashes   '[]'$ Ulcers   '[]'$ Wounds Psychological:  '[]'$ History of anxiety   '[]'$  History of major depression.   Physical Examination  BP (!) 155/82 (BP Location: Right Arm)   Pulse 66   Resp 18   Ht '5\' 6"'$  (1.676 m)   Wt 168 lb 9.6 oz (76.5 kg)   BMI 27.21 kg/m  Gen:  WD/WN, NAD Head: [place]/AT, No temporalis wasting. Ear/Nose/Throat: Hearing grossly intact, nares w/o erythema or drainage Eyes: Conjunctiva clear. Sclera non-icteric Neck: Supple.  Trachea midline Pulmonary:  Good air movement, no use of accessory muscles.  Cardiac: RRR, no JVD Vascular:  Vessel Right Left  Radial Palpable Palpable           Musculoskeletal: M/S 5/5 throughout.  No deformity or atrophy. Diffuse varicosities that are small and spidery in the RLE. A large cluster of varicosities in the left medial calf, about 2 mm in diameter. No edema. Neurologic: Sensation grossly intact in extremities.  Symmetrical.  Speech is fluent.  Psychiatric: Judgment intact, Mood & affect appropriate for pt's clinical situation. Dermatologic: No rashes or ulcers noted.  No cellulitis or open wounds.      Labs No results found for this or any previous visit (from the past 2160 hour(s)).  Radiology No results found.  Assessment/Plan  Varicose veins of leg with pain, bilateral We repeated a left leg venous reflux study which shows only reflux in that area of varicosities in the left medial  calf.  The rest of the great saphenous veins without reflux. I think foam sclerotherapy would be a reasonable option for the varicosities on the left calf.  The laser ablation would not be appropriate with only the short segment reflux in the calf.  We will submit that for insurance approval.  She does have long segment reflux on the right and has considered laser ablation in the past but given her bilateral symptoms with unilateral disease, we discussed that I am not sure how much improvement she will have.  Restless leg  syndrome Rx for requip given today and if that helps she can discuss with her PCP about long term use.    Leotis Pain, MD  02/14/2022 12:03 PM    This note was created with Dragon medical transcription system.  Any errors from dictation are purely unintentional

## 2022-02-14 NOTE — Assessment & Plan Note (Signed)
Rx for requip given today and if that helps she can discuss with her PCP about long term use.

## 2022-04-14 ENCOUNTER — Encounter (INDEPENDENT_AMBULATORY_CARE_PROVIDER_SITE_OTHER): Payer: Self-pay

## 2022-06-28 ENCOUNTER — Encounter: Payer: Self-pay | Admitting: Oncology

## 2022-06-28 ENCOUNTER — Emergency Department
Admission: EM | Admit: 2022-06-28 | Discharge: 2022-06-28 | Disposition: A | Discharge status: Home / Self Care | Payer: 59 | Attending: Emergency Medicine | Admitting: Emergency Medicine

## 2022-06-28 ENCOUNTER — Other Ambulatory Visit: Payer: Self-pay

## 2022-06-28 ENCOUNTER — Emergency Department: Payer: 59

## 2022-06-28 DIAGNOSIS — R002 Palpitations: Secondary | ICD-10-CM | POA: Diagnosis not present

## 2022-06-28 DIAGNOSIS — R0602 Shortness of breath: Secondary | ICD-10-CM | POA: Diagnosis present

## 2022-06-28 LAB — CBC
HCT: 38.7 % (ref 36.0–46.0)
Hemoglobin: 13.2 g/dL (ref 12.0–15.0)
MCH: 31.7 pg (ref 26.0–34.0)
MCHC: 34.1 g/dL (ref 30.0–36.0)
MCV: 92.8 fL (ref 80.0–100.0)
Platelets: 279 10*3/uL (ref 150–400)
RBC: 4.17 MIL/uL (ref 3.87–5.11)
RDW: 12.9 % (ref 11.5–15.5)
WBC: 6.8 10*3/uL (ref 4.0–10.5)
nRBC: 0 % (ref 0.0–0.2)

## 2022-06-28 LAB — BASIC METABOLIC PANEL
Anion gap: 8 (ref 5–15)
BUN: 15 mg/dL (ref 8–23)
CO2: 23 mmol/L (ref 22–32)
Calcium: 9.1 mg/dL (ref 8.9–10.3)
Chloride: 107 mmol/L (ref 98–111)
Creatinine, Ser: 1.13 mg/dL — ABNORMAL HIGH (ref 0.44–1.00)
GFR, Estimated: 54 mL/min — ABNORMAL LOW (ref >60)
Glucose, Bld: 97 mg/dL (ref 70–99)
Potassium: 3.3 mmol/L — ABNORMAL LOW (ref 3.5–5.1)
Sodium: 138 mmol/L (ref 135–145)

## 2022-06-28 LAB — TROPONIN I (HIGH SENSITIVITY)
Troponin I (High Sensitivity): <2 ng/L (ref <18)
Troponin I (High Sensitivity): 2 ng/L (ref <18)

## 2022-06-28 MED ORDER — POTASSIUM CHLORIDE CRYS ER 20 MEQ PO TBCR
20.0000 meq | EXTENDED_RELEASE_TABLET | Freq: Once | ORAL | Status: AC
  Administered 2022-06-28: 20 meq via ORAL
  Filled 2022-06-28: qty 1

## 2022-06-28 NOTE — ED Triage Notes (Signed)
Pt to ED POV with husband for chest discomfort and SOB with exertion.   States "my heart is beating stronger, harder" since about 3 weeks ago, then started feeling "winded" with exertion about 3-4 days ago.   Denies actual pain or pressure in chest. Alert, oriented, ambulatory. Denies hx cardiac or pulmonary disease.  Hx seizures and states has been dealing with balance issues long-term, but no recent falls.

## 2022-06-28 NOTE — ED Provider Notes (Signed)
[facility] Provider Note    Event Date/Time   First MD Initiated Contact with Patient 06/28/22 1251     (approximate)   History   Chest Pain and Shortness of Breath   HPI  Angela Delacruz is a 67 y.o. female  who presents to the emergency department today because of concern for pounding in her chest. The patient states that it started a couple of weeks ago. It will last for 10-15 seconds. She does not feel like her heart is beating fast or irregular when it is happening. The patient says that she does feel it has been hard to catch her breath while it occurs. Seems to be somewhat more noticeable if she exerts herself. Denies any new medications. Denies similar symptoms in the past.       Physical Exam   Triage Vital Signs: ED Triage Vitals  Enc Vitals Group     BP 06/28/22 1221 (!) 153/70     Pulse Rate 06/28/22 1221 71     Resp 06/28/22 1221 16     Temp 06/28/22 1224 97.6 F (36.4 C)     Temp Source 06/28/22 1221 Oral     SpO2 06/28/22 1221 99 %     Weight 06/28/22 1218 160 lb (72.6 kg)     Height 06/28/22 1218 '5\' 6"'$  (1.676 m)     Head Circumference --      Peak Flow --      Pain Score 06/28/22 1218 0     Pain Loc --      Pain Edu? --      Excl. in [place]? --     Most recent vital signs: Vitals:   06/28/22 1221 06/28/22 1224  BP: (!) 153/70   Pulse: 71   Resp: 16   Temp:  97.6 F (36.4 C)  SpO2: 99%     General: Awake, alert, oriented. CV:  Good peripheral perfusion. Regular rate and rhythm. Resp:  Normal effort. Lungs clear. Abd:  No distention.     ED Results / Procedures / Treatments   Labs (all labs ordered are listed, but only abnormal results are displayed) Labs Reviewed  BASIC METABOLIC PANEL - Abnormal; Notable for the following components:      Result Value   Potassium 3.3 (*)    Creatinine, Ser 1.13 (*)    GFR, Estimated 54 (*)    All other components within normal limits  CBC  TROPONIN I (HIGH SENSITIVITY)   TROPONIN I (HIGH SENSITIVITY)     EKG  I, Nance Pear, attending physician, personally viewed and interpreted this EKG  EKG Time: 1217 Rate: 67 Rhythm: normal sinus rhythm Axis: left axis deviation Intervals: qtc 414 QRS: incomplete RBBB ST changes: no st elevation Impression: abnormal ekg    RADIOLOGY I independently interpreted and visualized the CXR. My interpretation: No pneumonia Radiology interpretation:  IMPRESSION:  No active cardiopulmonary disease. No evidence of pneumonia or  pulmonary edema.      PROCEDURES:  Critical Care performed: No  Procedures   MEDICATIONS ORDERED IN ED: Medications - No data to display   IMPRESSION / MDM / Ryan / ED COURSE  I reviewed the triage vital signs and the nursing notes.                              Differential diagnosis includes, but is not limited to, arrhythmia, acs, pneumonia  Patient's presentation  is most consistent with acute presentation with potential threat to life or bodily function.   Patient presented to the emergency department today because of concerns for palpitations.  EKG without concerning arrhythmia.  She was placed on the cardiac monitor however no concerning arrhythmias were noted.  Patient did not have any further episodes while here in the emergency department.  Troponin was negative x 2.  Chest x-ray without concerning abnormalities.  Did discuss with patient portance of following up with primary care.  Would potentially benefit from Holter monitor.     FINAL CLINICAL IMPRESSION(S) / ED DIAGNOSES   Final diagnoses:  Palpitations     Note:  This document was prepared using Dragon voice recognition software and may include unintentional dictation errors.    Nance Pear, MD 06/28/22 [phone]

## 2022-06-28 NOTE — Discharge Instructions (Signed)
Please seek medical attention for any high fevers, chest pain, shortness of breath, change in behavior, persistent vomiting, bloody stool or any other new or concerning symptoms.  

## 2022-08-01 ENCOUNTER — Encounter: Payer: Self-pay | Admitting: Oncology

## 2022-09-05 ENCOUNTER — Ambulatory Visit (INDEPENDENT_AMBULATORY_CARE_PROVIDER_SITE_OTHER): Payer: 59 | Admitting: Vascular Surgery

## 2022-09-05 ENCOUNTER — Encounter (INDEPENDENT_AMBULATORY_CARE_PROVIDER_SITE_OTHER): Payer: Self-pay | Admitting: Vascular Surgery

## 2022-09-05 VITALS — BP 153/83 | HR 59 | Resp 18 | Ht 66.0 in | Wt 173.0 lb

## 2022-09-05 DIAGNOSIS — I83813 Varicose veins of bilateral lower extremities with pain: Secondary | ICD-10-CM | POA: Diagnosis not present

## 2022-09-05 DIAGNOSIS — M7989 Other specified soft tissue disorders: Secondary | ICD-10-CM | POA: Diagnosis not present

## 2022-09-05 NOTE — Assessment & Plan Note (Signed)
Has previously been documented to have right great saphenous vein reflux and only reflux in the saphenous vein branch on the left.  Symptoms are unchanged.  Continue current medical and conservative therapies.  No role for intervention at this point.  Follow-up in 1 year unless symptoms deteriorate significantly

## 2022-09-05 NOTE — Assessment & Plan Note (Signed)
Improved

## 2022-09-05 NOTE — Progress Notes (Signed)
MRN : [id number]  Angela Delacruz is a 67 y.o. (03-22-1956) female who presents with chief complaint of  Chief Complaint  Patient presents with   Follow-up    f/u in 1 year with no studies  .  History of Present Illness: Patient returns today in follow up of her leg swelling, pain, and venous insufficiency.  Her legs are about the same as they were at her visit last year.  No major changes positive or negative weight.  Restless leg syndrome are her primary symptoms.  Swelling is actually under really good control currently and she has not been wearing her compression socks as she has not needed them.  No new ulceration or infection.  No fevers or chills.  No chest pain or shortness of breath.  Has previously been documented to have right great saphenous vein reflux and only reflux in the saphenous vein branch on the left  Current Outpatient Medications  Medication Sig Dispense Refill   escitalopram (LEXAPRO) 10 MG tablet Take by mouth.     estradiol (VIVELLE-DOT) 0.025 MG/24HR APPLY 1 PATCH TOPICALLY TWICE A WEEK     lamoTRIgine (LAMICTAL) 200 MG tablet 2 (two) times daily.     progesterone (PROMETRIUM) 100 MG capsule progesterone micronized 100 mg capsule     omeprazole (PRILOSEC) 40 MG capsule Take 40 mg by mouth 2 (two) times daily.     No current facility-administered medications for this visit.    Past Medical History:  Diagnosis Date   Complication of anesthesia    GERD (gastroesophageal reflux disease)    History of hiatal hernia    Hypothyroidism    Iron deficiency 11/17/2019   PONV (postoperative nausea and vomiting)    Seizures ([place])    EPILEPSY    Past Surgical History:  Procedure Laterality Date   APPENDECTOMY     AUGMENTATION MAMMAPLASTY  1996   explanted 2015   BREAST IMPLANT REMOVAL Bilateral 2015   CATARACT EXTRACTION W/PHACO Right 06/03/2016   Procedure: CATARACT EXTRACTION PHACO AND INTRAOCULAR LENS PLACEMENT ([place]);  Surgeon: Birder Robson, MD;  Location:  [facility] ORS;  Service: Ophthalmology;  Laterality: Right;  LOT PACK: [id number] H US:01:02 AP:52.8 CDE:13.78   CATARACT EXTRACTION W/PHACO Left 09/02/2016   Procedure: CATARACT EXTRACTION PHACO AND INTRAOCULAR LENS PLACEMENT ([facility]);  Surgeon: Birder Robson, MD;  Location: [facility] ORS;  Service: Ophthalmology;  Laterality: Left;  [place] 54.6 AP% 16.8 CDE 9.17 Fluid pack lot # [id number]   CHOLECYSTECTOMY     COLON SURGERY     RESECTION   IMPLANTATION VAGAL NERVE STIMULATOR     KNEE ARTHROSCOPY     OOPHORECTOMY     SALIVARY GLAND SURGERY     SHOULDER ARTHROSCOPY     WRIST SURGERY       Social History   Tobacco Use   Smoking status: Former    Packs/day: 1.00    Years: 20.00    Additional pack years: 0.00    Total pack years: 20.00    Types: Cigarettes    Quit date: 11/17/1994    Years since quitting: 27.8   Smokeless tobacco: Never  Vaping Use   Vaping Use: Never used  Substance Use Topics   Alcohol use: Yes    Comment: rare   Drug use: No      Family History  Problem Relation Age of Onset   Congenital heart disease Mother    Pancreatic cancer Father    Cancer Maternal Grandmother    Lung cancer Maternal Grandfather  Breast cancer Neg Hx      Allergies  Allergen Reactions   Depakote [Divalproex Sodium] Other (See Comments)    IV form caused burning in the arm and shoulder   Dilantin [Phenytoin] Other (See Comments)    IV form caused burning in the arm and shoulder    REVIEW OF SYSTEMS (Negative unless checked)   Constitutional: [] Weight loss  [] Fever  [] Chills Cardiac: [] Chest pain   [] Chest pressure   [] Palpitations   [] Shortness of breath when laying flat   [] Shortness of breath at rest   [] Shortness of breath with exertion. Vascular:  [x] Pain in legs with walking   [x] Pain in legs at rest   [] Pain in legs when laying flat   [] Claudication   [] Pain in feet when walking  [] Pain in feet at rest  [] Pain in feet when laying flat   [] History of DVT   [] Phlebitis    [x] Swelling in legs   [x] Varicose veins   [] Non-healing ulcers Pulmonary:   [] Uses home oxygen   [] Productive cough   [] Hemoptysis   [] Wheeze  [] COPD   [] Asthma Neurologic:  [] Dizziness  [] Blackouts   [x] Seizures   [] History of stroke   [] History of TIA  [] Aphasia   [] Temporary blindness   [] Dysphagia   [] Weakness or numbness in arms   [] Weakness or numbness in legs Musculoskeletal:  [] Arthritis   [] Joint swelling   [] Joint pain   [] Low back pain Hematologic:  [] Easy bruising  [] Easy bleeding   [] Hypercoagulable state   [x] Anemic  [] Hepatitis Gastrointestinal:  [] Blood in stool   [] Vomiting blood  [x] Gastroesophageal reflux/heartburn   [] Abdominal pain Genitourinary:  [x] Chronic kidney disease   [] Difficult urination  [] Frequent urination  [] Burning with urination   [] Hematuria Skin:  [] Rashes   [] Ulcers   [] Wounds Psychological:  [] History of anxiety   []  History of major depression.  Physical Examination  BP (!) 153/83 (BP Location: Left Arm)   Pulse (!) 59   Resp 18   Ht 5\' 6"  (1.676 m)   Wt 173 lb (78.5 kg)   BMI 27.92 kg/m  Gen:  WD/WN, NAD Head: [place]/AT, No temporalis wasting. Ear/Nose/Throat: Hearing grossly intact, nares w/o erythema or drainage Eyes: Conjunctiva clear. Sclera non-icteric Neck: Supple.  Trachea midline Pulmonary:  Good air movement, no use of accessory muscles.  Cardiac: RRR, no JVD Vascular:  Vessel Right Left  Radial Palpable Palpable                          PT Palpable Palpable  DP Palpable Palpable   Gastrointestinal: soft, non-tender/non-distended. No guarding/reflex.  Musculoskeletal: M/S 5/5 throughout.  No deformity or atrophy. No edema. Neurologic: Sensation grossly intact in extremities.  Symmetrical.  Speech is fluent.  Psychiatric: Judgment intact, Mood & affect appropriate for pt's clinical situation. Dermatologic: No rashes or ulcers noted.  No cellulitis or open wounds.      Labs Recent Results (from the past 2160 hour(s))   Basic metabolic panel     Status: Abnormal   Collection Time: 06/28/22 12:20 PM  Result Value Ref Range   Sodium 138 135 - 145 mmol/L   Potassium 3.3 (L) 3.5 - 5.1 mmol/L   Chloride 107 98 - 111 mmol/L   CO2 23 22 - 32 mmol/L   Glucose, Bld 97 70 - 99 mg/dL    Comment: Glucose reference range applies only to samples taken after fasting for at least 8 hours.   BUN 15 8 -  23 mg/dL   Creatinine, Ser 1.13 (H) 0.44 - 1.00 mg/dL   Calcium 9.1 8.9 - 10.3 mg/dL   GFR, Estimated 54 (L) >60 mL/min    Comment: (NOTE) Calculated using the CKD-EPI Creatinine Equation (2021)    Anion gap 8 5 - 15    Comment: Performed at [facility], [address] [place], [place] [address]  CBC     Status: None   Collection Time: 06/28/22 12:20 PM  Result Value Ref Range   WBC 6.8 4.0 - 10.5 K/uL   RBC 4.17 3.87 - 5.11 MIL/uL   Hemoglobin 13.2 12.0 - 15.0 g/dL   HCT 38.7 36.0 - 46.0 %   MCV 92.8 80.0 - 100.0 fL   MCH 31.7 26.0 - 34.0 pg   MCHC 34.1 30.0 - 36.0 g/dL   RDW 12.9 11.5 - 15.5 %   Platelets 279 150 - 400 K/uL   nRBC 0.0 0.0 - 0.2 %    Comment: Performed at [facility], [place], [place] [address]  Troponin I (High Sensitivity)     Status: None   Collection Time: 06/28/22 12:20 PM  Result Value Ref Range   Troponin I (High Sensitivity) <2 <18 ng/L    Comment: (NOTE) Elevated high sensitivity troponin I (hsTnI) values and significant  changes across serial measurements may suggest ACS but many other  chronic and acute conditions are known to elevate hsTnI results.  Refer to the "Links" section for chest pain algorithms and additional  guidance. Performed at [facility], [place], [place] [address]   Troponin I (High Sensitivity)     Status: None   Collection Time: 06/28/22  2:28 PM  Result Value Ref Range   Troponin I (High Sensitivity) 2 <18 ng/L    Comment: (NOTE) Elevated high sensitivity troponin I (hsTnI) values and  significant  changes across serial measurements may suggest ACS but many other  chronic and acute conditions are known to elevate hsTnI results.  Refer to the "Links" section for chest pain algorithms and additional  guidance. Performed at [facility], [address] [place], [place] [address]     Radiology No results found.  Assessment/Plan  Varicose veins of leg with pain, bilateral Has previously been documented to have right great saphenous vein reflux and only reflux in the saphenous vein branch on the left.  Symptoms are unchanged.  Continue current medical and conservative therapies.  No role for intervention at this point.  Follow-up in 1 year unless symptoms deteriorate significantly  Swelling of limb Improved.    Leotis Pain, MD  09/05/2022 3:37 PM    This note was created with Dragon medical transcription system.  Any errors from dictation are purely unintentional

## 2022-09-17 ENCOUNTER — Other Ambulatory Visit: Payer: Self-pay | Admitting: Obstetrics

## 2022-09-17 DIAGNOSIS — Z1231 Encounter for screening mammogram for malignant neoplasm of breast: Secondary | ICD-10-CM

## 2022-09-29 ENCOUNTER — Ambulatory Visit
Admission: RE | Admit: 2022-09-29 | Discharge: 2022-09-29 | Disposition: A | Discharge status: Home / Self Care | Payer: 59 | Source: Ambulatory Visit | Attending: Obstetrics | Admitting: Obstetrics

## 2022-09-29 DIAGNOSIS — Z1231 Encounter for screening mammogram for malignant neoplasm of breast: Secondary | ICD-10-CM | POA: Diagnosis not present

## 2022-10-01 ENCOUNTER — Other Ambulatory Visit: Payer: Self-pay | Admitting: Obstetrics

## 2022-10-01 DIAGNOSIS — R928 Other abnormal and inconclusive findings on diagnostic imaging of breast: Secondary | ICD-10-CM

## 2022-10-01 DIAGNOSIS — N6489 Other specified disorders of breast: Secondary | ICD-10-CM

## 2022-10-08 ENCOUNTER — Ambulatory Visit
Admission: RE | Admit: 2022-10-08 | Discharge: 2022-10-08 | Disposition: A | Discharge status: Home / Self Care | Payer: 59 | Source: Ambulatory Visit | Attending: Obstetrics | Admitting: Obstetrics

## 2022-10-08 DIAGNOSIS — N6489 Other specified disorders of breast: Secondary | ICD-10-CM | POA: Insufficient documentation

## 2022-10-08 DIAGNOSIS — R928 Other abnormal and inconclusive findings on diagnostic imaging of breast: Secondary | ICD-10-CM | POA: Insufficient documentation

## 2023-03-02 ENCOUNTER — Encounter: Payer: Self-pay | Admitting: Urology

## 2023-03-02 ENCOUNTER — Ambulatory Visit (INDEPENDENT_AMBULATORY_CARE_PROVIDER_SITE_OTHER): Payer: 59 | Admitting: Urology

## 2023-03-02 VITALS — BP 157/81 | HR 84 | Ht 66.0 in | Wt 177.0 lb

## 2023-03-02 DIAGNOSIS — R339 Retention of urine, unspecified: Secondary | ICD-10-CM | POA: Diagnosis not present

## 2023-03-02 DIAGNOSIS — R310 Gross hematuria: Secondary | ICD-10-CM

## 2023-03-02 DIAGNOSIS — R8281 Pyuria: Secondary | ICD-10-CM | POA: Diagnosis not present

## 2023-03-02 LAB — URINALYSIS, COMPLETE
Bilirubin, UA: NEGATIVE
Glucose, UA: NEGATIVE
Ketones, UA: NEGATIVE
Nitrite, UA: NEGATIVE
Protein,UA: NEGATIVE
Specific Gravity, UA: 1.01 (ref 1.005–1.030)
Urobilinogen, Ur: 0.2 mg/dL (ref 0.2–1.0)
pH, UA: 6 (ref 5.0–7.5)

## 2023-03-02 LAB — MICROSCOPIC EXAMINATION

## 2023-03-02 LAB — BLADDER SCAN AMB NON-IMAGING: Scan Result: 200

## 2023-03-02 NOTE — Progress Notes (Signed)
I, Angela Delacruz, acting as a scribe for Angela Altes, Angela Delacruz., have documented all relevant documentation on the behalf of Angela Altes, Angela Delacruz, as directed by Angela Altes, Angela Delacruz while in the presence of Angela Altes, Angela Delacruz.  03/02/2023 9:25 PM   Sedalia Muta Esaw Dace 08-09-1955 [id number]  Referring provider: Enid Baas, Angela Delacruz [address] [place],  [place] [address]  Chief Complaint  Patient presents with   Hematuria    HPI: Angela Delacruz is a 67 y.o. female referred for evaluation of microhematuria.   Recent urinalysis have shown primarily pyuria and not microhematuria. Urine cultures have grown mixed flora.  Denies gross hematuria; states she underwent cystoscopy approximately 30 years ago for evaluation of microhematuria and was placed on low dose antibiotics for an extended period of time for "inflammation in the bladder". She has no bothersome lower urinary track symptoms. She does have moderate stress incontinence and wears a pad.  She complains of a urethral discharge, which she describes as sometimes milky and sometimes amber. She wears a liner and feels this is a urethral in etiology because it is near the top of her pad.   PMH: Past Medical History:  Diagnosis Date   Complication of anesthesia    GERD (gastroesophageal reflux disease)    History of hiatal hernia    Hypothyroidism    Iron deficiency 11/17/2019   PONV (postoperative nausea and vomiting)    Seizures ([facility])    EPILEPSY    Surgical History: Past Surgical History:  Procedure Laterality Date   APPENDECTOMY     AUGMENTATION MAMMAPLASTY  1996   explanted 2015   BREAST IMPLANT REMOVAL Bilateral 2015   CATARACT EXTRACTION W/PHACO Right 06/03/2016   Procedure: CATARACT EXTRACTION PHACO AND INTRAOCULAR LENS PLACEMENT ([facility]);  Surgeon: Galen Manila, Angela Delacruz;  Location: [facility] ORS;  Service: Ophthalmology;  Laterality: Right;  LOT PACK: [id number] H US:01:02 AP:52.8 CDE:13.78   CATARACT EXTRACTION W/PHACO  Left 09/02/2016   Procedure: CATARACT EXTRACTION PHACO AND INTRAOCULAR LENS PLACEMENT ([facility]);  Surgeon: Galen Manila, Angela Delacruz;  Location: [facility] ORS;  Service: Ophthalmology;  Laterality: Left;  [place] 54.6 AP% 16.8 CDE 9.17 Fluid pack lot # [id number]   CHOLECYSTECTOMY     COLON SURGERY     RESECTION   IMPLANTATION VAGAL NERVE STIMULATOR     KNEE ARTHROSCOPY     OOPHORECTOMY     SALIVARY GLAND SURGERY     SHOULDER ARTHROSCOPY     WRIST SURGERY      Home Medications:  Allergies as of 03/02/2023       Reactions   Depakote [divalproex Sodium] Other (See Comments)   IV form caused burning in the arm and shoulder   Dilantin [phenytoin] Other (See Comments)   IV form caused burning in the arm and shoulder        Medication List        Accurate as of March 02, 2023 11:59 PM. If you have any questions, ask your nurse or doctor.          escitalopram 10 MG tablet Commonly known as: LEXAPRO Take by mouth.   estradiol 0.025 MG/24HR Commonly known as: VIVELLE-DOT APPLY 1 PATCH TOPICALLY TWICE A WEEK   lamoTRIgine 200 MG tablet Commonly known as: LAMICTAL 2 (two) times daily.   omeprazole 40 MG capsule Commonly known as: PRILOSEC Take 40 mg by mouth 2 (two) times daily.   progesterone 100 MG capsule Commonly known as: PROMETRIUM progesterone micronized 100 mg capsule  Allergies:  Allergies  Allergen Reactions   Depakote [Divalproex Sodium] Other (See Comments)    IV form caused burning in the arm and shoulder   Dilantin [Phenytoin] Other (See Comments)    IV form caused burning in the arm and shoulder    Family History: Family History  Problem Relation Age of Onset   Congenital heart disease Mother    Pancreatic cancer Father    Cancer Maternal Grandmother    Lung cancer Maternal Grandfather    Breast cancer Neg Hx     Social History:  reports that she quit smoking about 28 years ago. Her smoking use included cigarettes. She started smoking about 48  years ago. She has a 20 pack-year smoking history. She has never used smokeless tobacco. She reports current alcohol use. She reports that she does not use drugs.   Physical Exam: BP (!) 157/81   Pulse 84   Ht 5\' 6"  (1.676 m)   Wt 177 lb (80.3 kg)   BMI 28.57 kg/m   Constitutional:  Alert and oriented, No acute distress. HEENT: [place] AT, moist mucus membranes.  Trachea midline, no masses. Cardiovascular: No clubbing, cyanosis, or edema. Respiratory: Normal respiratory effort, no increased work of breathing. GI: Abdomen is soft, nontender, nondistended, no abdominal masses Skin: No rashes, bruises or suspicious lesions. Neurologic: Grossly intact, no focal deficits, moving all 4 extremities. Psychiatric: Normal mood and affect.   Urinalysis Dipstick 1+ blood/2+ leukocytes, microscopy 11-30 WBC/3-10 RBC.   Assessment & Plan:    1. Sterile pyuria UA today with 11-30 WBCs  Repeat urine culture ordered  PVR today 200 mL We discuss potential etiologies including abnormalities of the bladder mucosa including tumor and urethral diverticulum.  2. Incomplete Bladder Emptying Pelvic exam at time of cysto May be a cause of her pyuria    I have reviewed the above documentation for accuracy and completeness, and I agree with the above.   Angela Altes, Angela Delacruz  [facility] [address], [address] [place], [place] [address] [phone]

## 2023-03-03 ENCOUNTER — Encounter: Payer: Self-pay | Admitting: Urology

## 2023-03-05 LAB — CULTURE, URINE COMPREHENSIVE

## 2023-04-09 ENCOUNTER — Ambulatory Visit: Payer: 59 | Admitting: Urology

## 2023-04-09 ENCOUNTER — Encounter: Payer: Self-pay | Admitting: Urology

## 2023-04-09 VITALS — BP 136/79 | HR 89 | Ht 65.0 in | Wt 175.0 lb

## 2023-04-09 DIAGNOSIS — N898 Other specified noninflammatory disorders of vagina: Secondary | ICD-10-CM | POA: Diagnosis not present

## 2023-04-09 DIAGNOSIS — R8281 Pyuria: Secondary | ICD-10-CM

## 2023-04-09 DIAGNOSIS — R339 Retention of urine, unspecified: Secondary | ICD-10-CM

## 2023-04-09 LAB — MICROSCOPIC EXAMINATION

## 2023-04-09 LAB — URINALYSIS, COMPLETE
Bilirubin, UA: NEGATIVE
Glucose, UA: NEGATIVE
Ketones, UA: NEGATIVE
Nitrite, UA: NEGATIVE
Protein,UA: NEGATIVE
Specific Gravity, UA: <1.005 — ABNORMAL LOW (ref 1.005–1.030)
Urobilinogen, Ur: 0.2 mg/dL (ref 0.2–1.0)
pH, UA: 6.5 (ref 5.0–7.5)

## 2023-04-09 NOTE — Progress Notes (Signed)
   04/09/23  CC:  Chief Complaint  Patient presents with   Cysto    HPI: Refer to my prior office note 03/02/2023.  Urine culture at that visit grew mixed flora.  UA today 6-10 WBC  Blood pressure 136/79, pulse 89, height 5\' 5"  (1.651 m), weight 175 lb (79.4 kg). NED. A&Ox3.   No respiratory distress   Abd soft, NT, ND Normal external genitalia with patent urethral meatus  Cystoscopy Procedure Note  Patient identification was confirmed, informed consent was obtained, and patient was prepped using Betadine solution.  Lidocaine jelly was administered per urethral meatus.    Procedure: - Flexible cystoscope introduced, without any difficulty.   - Thorough search of the bladder revealed:    normal urethral meatus; bladder with minimal volume on introduction of cystoscope    normal urothelium    no stones    no ulcers     no tumors    no urethral polyps    no trabeculation  - Ureteral orifices were normal in position and appearance.  Post-Procedure: - Patient tolerated the procedure well  Assessment/ Plan: No bladder mucosal abnormalities She has noted a milky vaginal discharge.  No evidence of urethral diverticulum.  We discussed pelvic MRI  however she is asymptomatic and does not desire to pursue at this time Recommend screening renal ultrasound for upper tract evaluation of her pyuria   Riki Altes, MD

## 2023-04-21 ENCOUNTER — Encounter: Payer: Self-pay | Admitting: Oncology

## 2023-04-24 ENCOUNTER — Ambulatory Visit
Admission: RE | Admit: 2023-04-24 | Discharge: 2023-04-24 | Disposition: A | Discharge status: Home / Self Care | Payer: 59 | Source: Ambulatory Visit | Attending: Urology | Admitting: Urology

## 2023-04-24 DIAGNOSIS — R8281 Pyuria: Secondary | ICD-10-CM | POA: Diagnosis present

## 2023-09-04 ENCOUNTER — Ambulatory Visit (INDEPENDENT_AMBULATORY_CARE_PROVIDER_SITE_OTHER): Payer: 59 | Admitting: Vascular Surgery

## 2023-11-03 ENCOUNTER — Encounter (INDEPENDENT_AMBULATORY_CARE_PROVIDER_SITE_OTHER): Payer: Self-pay

## 2023-11-30 ENCOUNTER — Other Ambulatory Visit: Payer: Self-pay | Admitting: Obstetrics and Gynecology

## 2023-11-30 DIAGNOSIS — Z1231 Encounter for screening mammogram for malignant neoplasm of breast: Secondary | ICD-10-CM

## 2023-12-17 ENCOUNTER — Encounter

## 2023-12-28 ENCOUNTER — Ambulatory Visit
Admission: RE | Admit: 2023-12-28 | Discharge: 2023-12-28 | Disposition: A | Discharge status: Home / Self Care | Source: Ambulatory Visit | Attending: Obstetrics and Gynecology | Admitting: Obstetrics and Gynecology

## 2023-12-28 DIAGNOSIS — Z1231 Encounter for screening mammogram for malignant neoplasm of breast: Secondary | ICD-10-CM | POA: Insufficient documentation

## 2024-02-18 ENCOUNTER — Other Ambulatory Visit: Payer: Self-pay | Admitting: Internal Medicine

## 2024-02-18 ENCOUNTER — Encounter: Payer: Self-pay | Admitting: Oncology

## 2024-02-18 ENCOUNTER — Ambulatory Visit
Admission: RE | Admit: 2024-02-18 | Discharge: 2024-02-18 | Disposition: A | Discharge status: Home / Self Care | Payer: PRIVATE HEALTH INSURANCE | Source: Ambulatory Visit | Attending: Internal Medicine | Admitting: Internal Medicine

## 2024-02-18 DIAGNOSIS — Z0000 Encounter for general adult medical examination without abnormal findings: Secondary | ICD-10-CM | POA: Diagnosis present

## 2024-02-18 DIAGNOSIS — R1084 Generalized abdominal pain: Secondary | ICD-10-CM | POA: Insufficient documentation

## 2024-02-18 LAB — POCT I-STAT CREATININE: Creatinine, Ser: 1.2 mg/dL — ABNORMAL HIGH (ref 0.44–1.00)

## 2024-02-18 MED ORDER — IOHEXOL 300 MG/ML  SOLN
100.0000 mL | Freq: Once | INTRAMUSCULAR | Status: AC | PRN
Start: 2024-02-18 — End: 2024-02-18
  Administered 2024-02-18: 100 mL via INTRAVENOUS

## 2024-03-14 ENCOUNTER — Encounter

## 2024-03-18 ENCOUNTER — Encounter: Payer: Self-pay | Admitting: Oncology

## 2024-03-18 ENCOUNTER — Encounter: Payer: Self-pay | Admitting: Gastroenterology

## 2024-03-18 NOTE — Progress Notes (Signed)
 PATIENT PROFILE: Angela Delacruz is a 68 y.o. female who presents to the [facility] for chief complaint of change in bowel habits, hematochezia, nonspecific pancolitis, and elevated fecal calprotectin. Pt was last seen in clinic as initial consult 01/27/2024 for GERD with esophagitis and colon cancer screening and scheduled for bidirectional endoscopies.   Brief History   Summary of GI Procedures:   EGD 11/02/2018 Cathe) - LA Grade A reflux esophagitis with biopsies showing chronic inflammation of gastric-type mucosa with no intestinal metaplasia identified, minimal chronic gastritis, normal duodenum    CSY 11/02/2018 Cathe) - diverticulosis in sigmoid, descending, and transverse colon, otherwise normal examined colon, Grade I IH   CSY 11/29/2014 - diverticulosis in sigmoid, descending, transverse, and ascending colon, s/p sigmoid resection with normal surgical anastomosis at 15 cm, otherwise normal colonoscopy to cecum   EGD 11/29/2014 - normal esophagus, gastritis, one 5 mm gastric polyp    Interval History   [name] presents to the [place] [facility] for chief complaint of hematochezia, change in bowel habits, bilateral lower abdominal pain, elevated fecal calprotectin, and pancolitis. Since her last visit with me almost 2 months ago, she has had several new GI symptoms arise. She had annual physical exam with PCP on 9/4 last month where she complained of 3-day history of acute onset lower abdominal pain with associated diarrhea without hematochezia. She had evidence of leukocytosis (15K) with normal LFTs. CT abd/pelvis performed same day commented on diffuse mild wall thickening and mucosal hyperenhancement of ascending, transverse, descending, and sigmoid colon with mild haziness surrounding the descending and sigmoid colon c/w nonspecific pancolitis. She was started on 10-days of Augmentin 875 mg BID. She took antibiotics as prescribed and had improvement in abdominal pain and  diarrhea. However, symptoms seemed to return last week. She cancelled her scheduled EGD/CSY that were scheduled on 9/29 for earlier this week due to have follow-up visit with PCP. She and her husband recently returned from a [place] vacation. After several days of being home she developed significant diarrhea and hematochezia. Symptoms seemed to start after eating fettuccini alfredo. She reports she was up all night having diarrhea every 2 hours and throughout the day. Stools very watery. She would have lower abdominal pain as well. PCP ordered further stool testing which showed negative GI PCR, but elevated fecal calprotectin 453. She is continuing to not feel her best. Her last BM was 2 days ago. No rectal bleeding over the past 2-days. She is continuing to have intermittent spasms of lower abdominal pain. She has been trying to avoid foods with fiber.   GENERAL REVIEW OF SYSTEMS: General: negative for - fever, chills, weight gain, weight loss, + fatigue Head: no injury, migraines, headaches Eyes: no jaundice, itching, dryness, tearing, redness, vision changes Nose: no injury, bleeding Mouth/Throat: no oral ulcers, swollen neck, dry mouth, sore throat, hoarseness Endocrine: no heat/cold intolerance Respiratory: no cough, wheezing, SOB Cardiovascular: no chest pain, palpitations GI: see HPI Musculoskeletal: no joint swelling, muscle/joint pain  Neurological: no seizures, syncope, dizziness, numbness/tingling Skin: no rashes, itching Hematological and Lymphatic: easy bruising, easy bleeding  MEDICATIONS: Outpatient Encounter Medications as of 03/18/2024  Medication Sig Dispense Refill  . amLODIPine (NORVASC) 5 MG tablet Take 1 tablet (5 mg total) by mouth once daily 30 tablet 2  . busPIRone (BUSPAR) 10 MG tablet Take 10 mg by mouth 2 (two) times daily    . estradiol (DOTTI) patch 0.05 mg/24 hr Place 1 patch onto the skin twice a week  24 patch 3  . estradioL (ESTRACE) 0.01 % (0.1 mg/gram) vaginal  cream Place 0.5 g vaginally once daily 30 g 3  . lamoTRIgine (LAMICTAL) 200 MG tablet Take 1 tablet (200 mg total) by mouth 2 (two) times daily 180 tablet 3  . MIEBO, PF, 100 % Drop     . pantoprazole (PROTONIX) 40 MG DR tablet Take 1 tablet (40 mg total) by mouth once daily Take 15-20 mins before meal 90 tablet 1  . progesterone (PROMETRIUM) 200 MG capsule Take 1 capsule (200 mg total) by mouth at bedtime 90 capsule 3  . sodium, potassium, and magnesium (SUPREP) oral solution Take 1 Bottle by mouth as directed One kit contains 2 bottles.  Take both bottles at the times instructed by your provider. (Patient not taking: Reported on 03/18/2024) 354 mL 0   No facility-administered encounter medications on file as of 03/18/2024.    ALLERGIES: Depakote [divalproex] and Dilantin [phenytoin sodium extended]  PAST MEDICAL HISTORY: Past Medical History:  Diagnosis Date  . Acute gastritis without hemorrhage 02/13/2017  . Arthritis   . Chronic fatigue 02/13/2017  . Chronic pain disorder 08/29/2015  . GERD (gastroesophageal reflux disease)   . Migraines   . Multiple thyroid nodules 08/29/2015  . Seizure disorder (CMS/HHS-HCC) 08/29/2015  . Seizures (CMS/HHS-HCC)    epilepsy  . Tendonitis of wrist, right 11/10/2016    PAST SURGICAL HISTORY: Past Surgical History:  Procedure Laterality Date  . APPENDECTOMY  1973  . EXCISION GANGLION CYST WRIST PRIMARY Left 1982  . CYSTECTOMY Right 1982   right palm  . UNLISTED LAPAROSCOPY PROCEDURE OVIDUCT/OVARY  1985  . REMOVAL BREAST IMPLANT  1997  . VNS Implant  2002  . CHOLECYSTECTOMY  2003  . HEMORRHOIDECTOMY BY SIMPLE LIGATION  2008  . shoulder surgery  2009  . LAPAROSCOPIC COLON RESECTION  2011  . BIOPSY SALIVARY GLAND  2011   stones   . knee surgery  2013  . VNS battery replacement  2015  . BREAST IMPLANT REMOVAL Bilateral 2016  . CATARACT EXTRACTION Right 05/2016  . CATARACT EXTRACTION Left 08/2016  . COLONOSCOPY N/A 11/02/2018   Dr. FABIENE Holmes @ [facility] - Diverticulosis; Int. Hemorrh. CBF 10/2023 (11/23/23 Recall [email or web address])  . EGD  11/02/2018   Esophagitis; Gastritis: No repeat   . BREAST IMPLANT REMOVAL     one replaced  . COLON SURGERY  2011  . IMPLANTATION NEUROSTIMULATOR ELECTRODES FOR PERIPHERAL NERVE    . OOPHORECTOMY Left   . TUBAL LIGATION       FAMILY HISTORY: Family History  Problem Relation Name Age of Onset  . Heart failure Mother    . Pancreatic cancer Father Gene   . Alcohol abuse Father Gene   . Thyroid disease Sister D1   . No Known Problems Sister    . No Known Problems Sister    . Lung cancer Maternal Grandmother    . No Known Problems Maternal Grandfather    . No Known Problems Paternal Grandmother    . No Known Problems Paternal Grandfather       SOCIAL HISTORY: Social History   Socioeconomic History  . Marital status: Married    Spouse name: Signe  . Number of children: 4  Occupational History  . Occupation: Associate Professor  Tobacco Use  . Smoking status: Former    Current packs/day: 0.00    Types: Cigarettes    Quit date: 07/18/1995    Years since quitting: 28.6  . Smokeless tobacco: Never  Vaping Use  . Vaping status: Never Used  Substance and Sexual Activity  . Alcohol use: Never  . Drug use: Never  . Sexual activity: Yes    Partners: Male    Birth control/protection: Post-menopausal    Comment: Husband  Other Topics Concern  . Would you please tell us  about the people who live in your home, your pets, or anything else important to your social life? No   Social Drivers of Corporate investment banker Strain: Low Risk  (03/14/2024)   Overall Financial Resource Strain (CARDIA)   . Difficulty of Paying Living Expenses: Not hard at all  Food Insecurity: No Food Insecurity (03/14/2024)   Hunger Vital Sign   . Worried About Programme researcher, broadcasting/film/video in the Last Year: Never true   . Ran Out of Food in the Last Year: Never true  Transportation Needs: No Transportation Needs (03/14/2024)    PRAPARE - Transportation   . Lack of Transportation (Medical): No   . Lack of Transportation (Non-Medical): No    Vitals:   03/18/24 0948  BP: (!) 148/78  Pulse: 71  Weight: 82.4 kg (181 lb 9.6 oz)  Height: 167.6 cm (5' 6)     PHYSICAL EXAM: Non-toxic appearing female in no acute distress. Answers all questions appropriately.  General: NAD, alert and oriented x 4 HEENT: PEERLA, EOMI, anciteric  Neck: supple, no JVD or thyromegaly. No lymphadenopathy.  Respiratory: CTA bilaterally, no wheezes, crackles, or other adventitious sounds Cardiac: RRR, no murmur, rub, or gallop  GI: soft, normal bowel sounds, no TTP, no HSM, no rebound or guarding MSK: no edema, well perfused with 2+ pulses, Skin: Skin color, texture, turgor normal, no rashes or lesions Lymph: no LAD Neuro: Grossly intact   REVIEW OF DATA: I have reviewed the following data today:  Previous OV notes with PCP reviewed - see HPI  9/30 - GI PCR negative, fecal calprotectin 453  CT abd/pelvis with contrast 02/18/2024: IMPRESSION:  1. There is diffuse wall thickening and mucosal hyperenhancement of  the ascending, transverse, descending, and sigmoid colonic with mild  haziness surrounding the descending and sigmoid colon, most  compatible with a nonspecific pancolitis.  2. Colonic diverticulosis.  3. Similar small hiatal hernia.  4.  Aortic Atherosclerosis (ICD10-I70.0).   ASSESSMENT AND PLAN: Ms. Brittingham is a 68 y.o. female presenting for follow up: Diagnoses and all orders for this visit:  Hematochezia -     Ambulatory Referral to Colonoscopy and Upper Endoscopy  Pancolitis Comments: seen on recent CT scan 02/18/24 Orders: -     Ambulatory Referral to Colonoscopy and Upper Endoscopy  Elevated fecal calprotectin (453) -     Ambulatory Referral to Colonoscopy and Upper Endoscopy  Change in bowel habits -     Ambulatory Referral to Colonoscopy and Upper Endoscopy  Bilateral lower abdominal pain -      Ambulatory Referral to Colonoscopy and Upper Endoscopy  History of elective sigmoid colectomy (2011)  Diverticulosis of colon  Gastroesophageal reflux disease with esophagitis without hemorrhage -     Ambulatory Referral to Colonoscopy and Upper Endoscopy    68 y/o Caucasian female with a PMH of hx of seizure disorder, migraines, chronic fatigue, hypothyroidism, GAD, multiple thyroid nodules, Vit B12 deficiency, osteopenia, and GERD presents to the [facility] for chief complaint of pancolitis   Pancolitis - nonspecific, seen on recent CT scan 02/18/24  Hematochezia - likely 2/2 #1  Elevated fecal calprotectin (453)  Change in bowel habits  Bilateral lower abdominal pain  Hx of elective sigmoid colectomy (2011)  GERD with esophagitis   - Reviewed recent CT scan, labs, and stool studies which did show evidence of pancolitis. Symptoms could be 2/2 ischemic colitis, acute gastroenteritis, IBD, SCAD, malignancy, etc.  - Recommend EGD and colonoscopy for further evaluation. Procedures scheduled for Monday (10/6) with Dr. Unk in [facility] - Consider trial of peppermint oil gel capsules for abdominal cramping - Encouraged patient to avoid dehydration and long periods of fasting - Low-residue diet encouraged - Avoid NSAIDs - Further recs after procedures  I reviewed the risks (including bleeding, perforation, infection, anesthesia complications, cardiac/respiratory complications), benefits and alternatives of EGD and colonoscopy. Patient consents to proceed. Denies CP/SOB/blood thinners.    Attestation Statement:   I personally performed the service, non-incident to. (WP)   GRANVILLE MASON CROLEY, PA  40 minutes of patient care time, >50% face to face    Jonette Primmer, PA-C [facility], Gastroenterology [address] [place], [place] [address] [phone]

## 2024-03-18 NOTE — Anesthesia Preprocedure Evaluation (Addendum)
 Anesthesia Evaluation  Patient identified by MRN, date of birth, ID band Patient awake    Reviewed: Allergy & Precautions, H&P , NPO status , Patient's Chart, lab work & pertinent test results  History of Anesthesia Complications (+) PONV and history of anesthetic complications  Airway Mallampati: III  TM Distance: >3 FB Neck ROM: Full  Mouth opening: Limited Mouth Opening  Dental no notable dental hx.  Has rearmolar lower partial, but rest of teeth are intact and good:   Pulmonary neg pulmonary ROS, former smoker   Pulmonary exam normal breath sounds clear to auscultation       Cardiovascular hypertension, negative cardio ROS Normal cardiovascular exam Rhythm:Regular Rate:Normal  07-30-22 echo DOPPLER ECHO and OTHER SPECIAL PROCEDURES               Aortic: TRIVIAL AR                 No AS               Mitral: TRIVIAL MR                 No MS                       MV Inflow E Vel = 81.8 cm/sec     MV Annulus E'Vel = 8.5 cm/sec                       E/E'Ratio = 9.6            Tricuspid: MILD TR                    No TS                       267.0 cm/sec peak TR vel   31.5 mmHg peak RV pressure            Pulmonary: TRIVIAL PR                 No PS _________________________________________________________________________________________ INTERPRETATION NORMAL LEFT VENTRICULAR SYSTOLIC FUNCTION NORMAL RIGHT VENTRICULAR SYSTOLIC FUNCTION MILD VALVULAR REGURGITATION (See above) NO VALVULAR STENOSIS GLS 19.9% ____________________________     Neuro/Psych Seizures -,  PSYCHIATRIC DISORDERS Anxiety Depression    negative neurological ROS  negative psych ROS   GI/Hepatic negative GI ROS, Neg liver ROS, hiatal hernia,GERD  ,,  Endo/Other  negative endocrine ROSHypothyroidism    Renal/GU Renal diseasenegative Renal ROS  negative genitourinary   Musculoskeletal negative musculoskeletal ROS (+) Arthritis ,    Abdominal    Peds negative pediatric ROS (+)  Hematology negative hematology ROS (+)   Anesthesia Other Findings Seizures (HCC)  GERD (gastroesophageal reflux disease) History of hiatal hernia Hypothyroidism Complication of anesthesia  PONV (postoperative nausea and vomiting) Iron  deficiency  Acquired hypothyroidism Generalized anxiety disorder  Heart palpitations 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 Depression  IBS (irritable bowel syndrome) Former smoker, stopped smoking in distant past  Migraines Mild tricuspid regurgitation by prior echocardiogram      Reproductive/Obstetrics negative OB ROS                              Anesthesia Physical Anesthesia Plan  ASA: 3  Anesthesia Plan: General   Post-op Pain Management:    Induction: Intravenous  PONV Risk Score and Plan:  Airway Management Planned: Natural Airway and Nasal Cannula  Additional Equipment:   Intra-op Plan:   Post-operative Plan:   Informed Consent: I have reviewed the patients History and Physical, chart, labs and discussed the procedure including the risks, benefits and alternatives for the proposed anesthesia with the patient or authorized representative who has indicated his/her understanding and acceptance.     Dental Advisory Given  Plan Discussed with: Anesthesiologist, CRNA and Surgeon  Anesthesia Plan Comments: (Patient consented for risks of anesthesia including but not limited to:  - adverse reactions to medications - risk of airway placement if required - damage to eyes, teeth, lips or other oral mucosa - nerve damage due to positioning  - sore throat or hoarseness - Damage to heart, brain, nerves, lungs, other parts of body or loss of life  Patient voiced understanding and assent.)         Anesthesia Quick Evaluation

## 2024-03-21 ENCOUNTER — Encounter: Payer: Self-pay | Admitting: Gastroenterology

## 2024-03-21 ENCOUNTER — Ambulatory Visit: Payer: PRIVATE HEALTH INSURANCE | Admitting: Anesthesiology

## 2024-03-21 ENCOUNTER — Ambulatory Visit
Admission: RE | Admit: 2024-03-21 | Discharge: 2024-03-21 | Disposition: A | Discharge status: Home / Self Care | Payer: PRIVATE HEALTH INSURANCE | Attending: Gastroenterology | Admitting: Gastroenterology

## 2024-03-21 ENCOUNTER — Other Ambulatory Visit: Payer: Self-pay

## 2024-03-21 ENCOUNTER — Encounter
Admission: RE | Disposition: A | Discharge status: Home / Self Care | Payer: Self-pay | Source: Home / Self Care | Attending: Gastroenterology

## 2024-03-21 DIAGNOSIS — F411 Generalized anxiety disorder: Secondary | ICD-10-CM | POA: Insufficient documentation

## 2024-03-21 DIAGNOSIS — Z87891 Personal history of nicotine dependence: Secondary | ICD-10-CM | POA: Insufficient documentation

## 2024-03-21 DIAGNOSIS — G40909 Epilepsy, unspecified, not intractable, without status epilepticus: Secondary | ICD-10-CM | POA: Insufficient documentation

## 2024-03-21 DIAGNOSIS — R933 Abnormal findings on diagnostic imaging of other parts of digestive tract: Secondary | ICD-10-CM | POA: Diagnosis not present

## 2024-03-21 DIAGNOSIS — K529 Noninfective gastroenteritis and colitis, unspecified: Secondary | ICD-10-CM | POA: Diagnosis present

## 2024-03-21 DIAGNOSIS — K2100 Gastro-esophageal reflux disease with esophagitis, without bleeding: Secondary | ICD-10-CM | POA: Diagnosis not present

## 2024-03-21 DIAGNOSIS — R195 Other fecal abnormalities: Secondary | ICD-10-CM | POA: Diagnosis present

## 2024-03-21 DIAGNOSIS — K921 Melena: Secondary | ICD-10-CM | POA: Diagnosis present

## 2024-03-21 DIAGNOSIS — K5730 Diverticulosis of large intestine without perforation or abscess without bleeding: Secondary | ICD-10-CM | POA: Diagnosis not present

## 2024-03-21 DIAGNOSIS — I10 Essential (primary) hypertension: Secondary | ICD-10-CM | POA: Diagnosis not present

## 2024-03-21 HISTORY — DX: Melena: K92.1

## 2024-03-21 HISTORY — DX: Essential (primary) hypertension: I10

## 2024-03-21 HISTORY — DX: Presence of other specified functional implants: Z96.89

## 2024-03-21 HISTORY — DX: Unspecified osteoarthritis, unspecified site: M19.90

## 2024-03-21 HISTORY — DX: Noninfective gastroenteritis and colitis, unspecified: K52.9

## 2024-03-21 HISTORY — DX: Generalized anxiety disorder: F41.1

## 2024-03-21 HISTORY — DX: Depression, unspecified: F32.A

## 2024-03-21 HISTORY — DX: Irritable bowel syndrome, unspecified: K58.9

## 2024-03-21 HISTORY — DX: Personal history of nicotine dependence: Z87.891

## 2024-03-21 HISTORY — DX: Rheumatic tricuspid insufficiency: I07.1

## 2024-03-21 HISTORY — DX: Varicose veins of unspecified lower extremity with pain: I83.819

## 2024-03-21 HISTORY — DX: Palpitations: R00.2

## 2024-03-21 SURGERY — COLONOSCOPY
Anesthesia: General | Site: Mouth

## 2024-03-21 MED ORDER — LACTATED RINGERS IV SOLN: INTRAVENOUS | Status: DC

## 2024-03-21 MED ORDER — PROPOFOL 10 MG/ML IV BOLUS
INTRAVENOUS | Status: DC | PRN
  Administered 2024-03-21: 160 ug/kg/min via INTRAVENOUS
  Administered 2024-03-21: 150 mg via INTRAVENOUS

## 2024-03-21 MED ORDER — ONDANSETRON HCL 4 MG/2ML IJ SOLN
INTRAMUSCULAR | Status: AC
  Filled 2024-03-21: qty 2

## 2024-03-21 MED ORDER — LIDOCAINE HCL (CARDIAC) PF 100 MG/5ML IV SOSY
PREFILLED_SYRINGE | INTRAVENOUS | Status: DC | PRN
Start: 2024-03-21 — End: 2024-03-21
  Administered 2024-03-21: 60 mg via INTRAVENOUS

## 2024-03-21 MED ORDER — STERILE WATER FOR IRRIGATION IR SOLN
Status: DC | PRN
  Administered 2024-03-21: 1

## 2024-03-21 MED ORDER — ONDANSETRON HCL 4 MG/2ML IJ SOLN
4.0000 mg | Freq: Once | INTRAMUSCULAR | Status: AC
  Administered 2024-03-21: 4 mg via INTRAVENOUS

## 2024-03-21 SURGICAL SUPPLY — 11 items
BLOCK BITE 60FR ADLT L/F GRN (MISCELLANEOUS) ×2 IMPLANT
ELECTRODE REM PT RTRN 9FT ADLT (ELECTROSURGICAL) IMPLANT
FORCEPS BIOP RAD 4 LRG CAP 4 (CUTTING FORCEPS) IMPLANT
FORCEPS ESCP3.2XJMB 240X2.8X (MISCELLANEOUS) IMPLANT
GAUZE SPONGE 4X4 12PLY STRL (GAUZE/BANDAGES/DRESSINGS) IMPLANT
GOWN CVR UNV OPN BCK APRN NK (MISCELLANEOUS) ×4 IMPLANT
KIT PROCEDURE OLYMPUS (MISCELLANEOUS) ×2 IMPLANT
MANIFOLD NEPTUNE II (INSTRUMENTS) ×2 IMPLANT
SNARE COLD EXACTO (MISCELLANEOUS) IMPLANT
TRAP ETRAP POLY (MISCELLANEOUS) IMPLANT
WATER STERILE IRR 250ML POUR (IV SOLUTION) ×2 IMPLANT

## 2024-03-21 NOTE — Op Note (Signed)
 [facility] Patient Name: Angela Delacruz Procedure Date: 03/21/2024 10:42 AM MRN: [id number] Account #: [id number] Date of Birth: 1955-11-07 Admit Type: Outpatient Age: 67 Room: [facility] OR ROOM 01 Gender: Female Note Status: Finalized Instrument Name: Endoscope [id number] Procedure:             Upper GI endoscopy Indications:           Heartburn, Follow-up of gastro-esophageal reflux                         disease Providers:             Corinn Jess Brooklyn MD, MD Referring MD:          Lavenia Beaver, MD (Referring MD) Medicines:             General Anesthesia Complications:         No immediate complications. Estimated blood loss: None. Procedure:             Pre-Anesthesia Assessment:                        - Prior to the procedure, a History and Physical was                         performed, and patient medications and allergies were                         reviewed. The patient is competent. The risks and                         benefits of the procedure and the sedation options and                         risks were discussed with the patient. All questions                         were answered and informed consent was obtained.                         Patient identification and proposed procedure were                         verified by the physician, the nurse, the                         anesthesiologist, the anesthetist and the technician                         in the pre-procedure area in the procedure room in the                         endoscopy suite. Mental Status Examination: alert and                         oriented. Airway Examination: normal oropharyngeal                         airway and neck mobility. Respiratory Examination:  clear to auscultation. CV Examination: normal.                         Prophylactic Antibiotics: The patient does not require                         prophylactic antibiotics. Prior  Anticoagulants: The                         patient has taken no anticoagulant or antiplatelet                         agents. ASA Grade Assessment: III - A patient with                         severe systemic disease. After reviewing the risks and                         benefits, the patient was deemed in satisfactory                         condition to undergo the procedure. The anesthesia                         plan was to use general anesthesia. Immediately prior                         to administration of medications, the patient was                         re-assessed for adequacy to receive sedatives. The                         heart rate, respiratory rate, oxygen saturations,                         blood pressure, adequacy of pulmonary ventilation, and                         response to care were monitored throughout the                         procedure. The physical status of the patient was                         re-assessed after the procedure.                        After obtaining informed consent, the endoscope was                         passed under direct vision. Throughout the procedure,                         the patient's blood pressure, pulse, and oxygen                         saturations were monitored continuously. The Endoscope  was introduced through the mouth, and advanced to the                         third part of duodenum. The upper GI endoscopy was                         accomplished without difficulty. The patient tolerated                         the procedure well. Findings:      The esophagus was normal.      The stomach was normal.      The examined duodenum was normal. Impression:            - Normal esophagus.                        - Normal stomach.                        - Normal examined duodenum.                        - No specimens collected. Recommendation:        - Proceed with colonoscopy as scheduled                         See colonoscopy report Procedure Code(s):     --- Professional ---                        [phone], Esophagogastroduodenoscopy, flexible,                         transoral; diagnostic, including collection of                         specimen(s) by brushing or washing, when performed                         (separate procedure) Diagnosis Code(s):     --- Professional ---                        R12, Heartburn                        K21.9, Gastro-esophageal reflux disease without                         esophagitis CPT copyright 2022 American Medical Association. All rights reserved. The codes documented in this report are preliminary and upon coder review may  be revised to meet current compliance requirements. Dr. Corinn Brooklyn Corinn Jess Brooklyn MD, MD 03/21/2024 12:10:58 PM This report has been signed electronically. Number of Addenda: 0 Note Initiated On: 03/21/2024 10:42 AM Estimated Blood Loss:  Estimated blood loss: none.      [facility]

## 2024-03-21 NOTE — Anesthesia Postprocedure Evaluation (Signed)
 Anesthesia Post Note  Patient: Angela Delacruz  Procedure(s) Performed: COLONOSCOPY EGD (ESOPHAGOGASTRODUODENOSCOPY) (Mouth)  Patient location during evaluation: PACU Anesthesia Type: General Level of consciousness: awake and alert Pain management: pain level controlled Vital Signs Assessment: post-procedure vital signs reviewed and stable Respiratory status: spontaneous breathing, nonlabored ventilation, respiratory function stable and patient connected to nasal cannula oxygen Cardiovascular status: blood pressure returned to baseline and stable Postop Assessment: no apparent nausea or vomiting Anesthetic complications: no   No notable events documented.   Last Vitals:  Vitals:   03/21/24 1233 03/21/24 1245  BP: 132/72 (!) 149/68  Pulse: 77 67  Resp: (!) 21 14  Temp: (!) 36.3 C   SpO2: 99% 99%    Last Pain:  Vitals:   03/21/24 1233  TempSrc:   PainSc: Asleep                 Syair Fricker C Maudy Yonan

## 2024-03-21 NOTE — Op Note (Signed)
 [facility] Gastroenterology Patient Name: Angela Delacruz Procedure Date: 03/21/2024 10:41 AM MRN: [id number] Account #: [id number] Date of Birth: Nov 20, 1955 Admit Type: Outpatient Age: 68 Room: [facility] OR ROOM 01 Gender: Female Note Status: Finalized Instrument Name: Colonoscope [id number] Procedure:             Colonoscopy Indications:           Chronic diarrhea, Clinically significant diarrhea of                         unexplained origin, Abnormal CT of the GI tract,                         elevated fecal calprotectin Providers:             Corinn Jess Brooklyn MD, MD Referring MD:          Lavenia Beaver, MD (Referring MD) Medicines:             General Anesthesia Complications:         No immediate complications. Estimated blood loss: None. Procedure:             Pre-Anesthesia Assessment:                        - Prior to the procedure, a History and Physical was                         performed, and patient medications and allergies were                         reviewed. The patient is competent. The risks and                         benefits of the procedure and the sedation options and                         risks were discussed with the patient. All questions                         were answered and informed consent was obtained.                         Patient identification and proposed procedure were                         verified by the physician, the nurse, the                         anesthesiologist, the anesthetist and the technician                         in the pre-procedure area in the procedure room in the                         endoscopy suite. Mental Status Examination: alert and                         oriented. Airway Examination: normal oropharyngeal  airway and neck mobility. Respiratory Examination:                         clear to auscultation. CV Examination: normal.                         Prophylactic  Antibiotics: The patient does not require                         prophylactic antibiotics. Prior Anticoagulants: The                         patient has taken no anticoagulant or antiplatelet                         agents. ASA Grade Assessment: III - A patient with                         severe systemic disease. After reviewing the risks and                         benefits, the patient was deemed in satisfactory                         condition to undergo the procedure. The anesthesia                         plan was to use general anesthesia. Immediately prior                         to administration of medications, the patient was                         re-assessed for adequacy to receive sedatives. The                         heart rate, respiratory rate, oxygen saturations,                         blood pressure, adequacy of pulmonary ventilation, and                         response to care were monitored throughout the                         procedure. The physical status of the patient was                         re-assessed after the procedure.                        After obtaining informed consent, the colonoscope was                         passed under direct vision. Throughout the procedure,                         the patient's blood pressure, pulse, and oxygen  saturations were monitored continuously. The                         Colonoscope was introduced through the anus and                         advanced to the the terminal ileum, with                         identification of the appendiceal orifice and IC                         valve. The colonoscopy was performed without                         difficulty. The patient tolerated the procedure well.                         The quality of the bowel preparation was evaluated                         using the BBPS [facility] Bowel Preparation Scale) with                         scores of: Right  Colon = 3, Transverse Colon = 3 and                         Left Colon = 3 (entire mucosa seen well with no                         residual staining, small fragments of stool or opaque                         liquid). The total BBPS score equals 9. The terminal                         ileum, ileocecal valve, appendiceal orifice, and                         rectum were photographed. Findings:      The perianal and digital rectal examinations were normal. Pertinent       negatives include normal sphincter tone and no palpable rectal lesions.      Normal mucosa was found in the left colon and in the right colon.       Biopsies were taken with a cold forceps for histology.      The terminal ileum appeared normal. Biopsies were taken with a cold       forceps for histology.      Multiple medium-mouthed diverticula were found in the entire colon.      The retroflexed view of the distal rectum and anal verge was normal and       showed no anal or rectal abnormalities. Impression:            - Normal mucosa in the left colon and in the right                         colon. Biopsied.                        -  The examined portion of the ileum was normal.                         Biopsied.                        - Diverticulosis in the entire examined colon.                        - The distal rectum and anal verge are normal on                         retroflexion view. Recommendation:        - Discharge patient to home (with escort).                        - Resume previous diet today.                        - Continue present medications.                        - Await pathology results.                        - Return to GI clinic as previously scheduled. Procedure Code(s):     --- Professional ---                        [phone], Colonoscopy, flexible; with biopsy, single or                         multiple Diagnosis Code(s):     --- Professional ---                        K52.9, Noninfective  gastroenteritis and colitis,                         unspecified                        R19.7, Diarrhea, unspecified                        K57.30, Diverticulosis of large intestine without                         perforation or abscess without bleeding                        R93.3, Abnormal findings on diagnostic imaging of                         other parts of digestive tract CPT copyright 2022 American [employer] Association. All rights reserved. The codes documented in this report are preliminary and upon coder review may  be revised to meet current compliance requirements. Dr. Corinn Brooklyn Corinn Jess Brooklyn MD, MD 03/21/2024 12:47:13 PM This report has been signed electronically. Number of Addenda: 0 Note Initiated On: 03/21/2024 10:41 AM Scope Withdrawal Time: 0 hours 11 minutes 15 seconds  Total Procedure Duration: 0 hours 15 minutes 44 seconds  Estimated Blood Loss:  Estimated blood loss: none.      [facility]

## 2024-03-21 NOTE — Transfer of Care (Signed)
 Immediate Anesthesia Transfer of Care Note  Patient: Angela Delacruz  Procedure(s) Performed: COLONOSCOPY EGD (ESOPHAGOGASTRODUODENOSCOPY) (Mouth)  Patient Location: PACU  Anesthesia Type: General  Level of Consciousness: awake, alert  and patient cooperative  Airway and Oxygen Therapy: Patient Spontanous Breathing   Post-op Assessment: Post-op Vital signs reviewed, Patient's Cardiovascular Status Stable, Respiratory Function Stable, Patent Airway and No signs of Nausea or vomiting  Post-op Vital Signs: Reviewed and stable  Complications: No notable events documented.

## 2024-03-21 NOTE — H&P (Signed)
 Angela JONELLE Brooklyn, MD [facility] Gastroenterology, [facility] [address]  [place], [place] [address]  Main: [phone] Fax:  [phone] Pager: [phone]   Primary Care Physician:  Angela Bail, MD Primary Gastroenterologist:  Dr. Corinn JONELLE Delacruz  Pre-Procedure History & Physical: HPI:  Angela Delacruz is a 68 y.o. female is here for an endoscopy and colonoscopy.   Past Medical History:  Diagnosis Date   Arthritis    Complication of anesthesia    Depression    Former smoker, stopped smoking in distant past    Generalized anxiety disorder    GERD (gastroesophageal reflux disease)    Heart palpitations    Hematochezia    History of hiatal hernia    Hypertension    IBS (irritable bowel syndrome)    Iron  deficiency 11/17/2019   Mild tricuspid regurgitation by prior echocardiogram    Pancolitis    PONV (postoperative nausea and vomiting)    Seizures (HCC)    EPILEPSY   Status post VNS (vagus nerve stimulator) placement    Varicose veins of leg with pain     Past Surgical History:  Procedure Laterality Date   APPENDECTOMY     AUGMENTATION MAMMAPLASTY  1996   explanted 2015   BREAST IMPLANT REMOVAL Bilateral 2015   CATARACT EXTRACTION W/PHACO Right 06/03/2016   Procedure: CATARACT EXTRACTION PHACO AND INTRAOCULAR LENS PLACEMENT (IOC);  Surgeon: Elsie Carmine, MD;  Location: [facility] ORS;  Service: Ophthalmology;  Laterality: Right;  LOT PACK: [id number] H US :01:02 AP:52.8 CDE:13.78   CATARACT EXTRACTION W/PHACO Left 09/02/2016   Procedure: CATARACT EXTRACTION PHACO AND INTRAOCULAR LENS PLACEMENT (IOC);  Surgeon: Elsie Carmine, MD;  Location: [facility] ORS;  Service: Ophthalmology;  Laterality: Left;  US  54.6 AP% 16.8 CDE 9.17 Fluid pack lot # [id number]   CHOLECYSTECTOMY     COLON SURGERY     RESECTION   IMPLANTATION VAGAL NERVE STIMULATOR     KNEE ARTHROSCOPY     OOPHORECTOMY     SALIVARY GLAND SURGERY     SHOULDER ARTHROSCOPY     WRIST SURGERY       Prior to Admission medications   Medication Sig Start Date End Date Taking? Authorizing Provider  amLODipine (NORVASC) 5 MG tablet Take 5 mg by mouth daily.   Yes [provider]  busPIRone (BUSPAR) 10 MG tablet Take 15 mg by mouth 2 (two) times daily.   Yes [provider]  estradiol (CLIMARA - DOSED IN MG/24 HR) 0.05 mg/24hr patch Place 0.05 mg onto the skin once a week.   Yes [provider]  estradiol (ESTRACE) 0.1 MG/GM vaginal cream Place 1 Applicatorful vaginally at bedtime.   Yes [provider]  lamoTRIgine (LAMICTAL) 200 MG tablet 2 (two) times daily. 10/09/19  Yes [provider]  pantoprazole (PROTONIX) 40 MG tablet Take 40 mg by mouth daily.   Yes [provider]  progesterone (PROMETRIUM) 100 MG capsule Take 200 mg by mouth daily.   Yes [provider]    Allergies as of 03/18/2024 - Review Complete 03/18/2024  Allergen Reaction Noted   Depakote [divalproex sodium] Other (See Comments) 05/28/2016   Dilantin [phenytoin] Other (See Comments) 05/28/2016    Family History  Problem Relation Age of Onset   Congenital heart disease Mother    Pancreatic cancer Father    Cancer Maternal Grandmother    Lung cancer Maternal Grandfather    Breast cancer Neg Hx     Social History   Socioeconomic History   Marital  status: Married    Spouse name: Not on file   Number of children: Not on file   Years of education: Not on file   Highest education level: Not on file  Occupational History   Not on file  Tobacco Use   Smoking status: Former    Current packs/day: 0.00    Average packs/day: 1 pack/day for 20.0 years (20.0 ttl pk-yrs)    Types: Cigarettes    Start date: 11/17/1974    Quit date: 11/17/1994    Years since quitting: 29.3   Smokeless tobacco: Never  Vaping Use   Vaping status: Never Used  Substance and Sexual Activity   Alcohol use: Yes    Alcohol/week: 1.0 standard drink of alcohol    Types: 1 Cans  of beer per week    Comment: rare   Drug use: Never   Sexual activity: Not on file  Other Topics Concern   Not on file  Social History Narrative   Not on file   Social Drivers of Health   Financial Resource Strain: Low Risk  (03/14/2024)   Received from [facility]   Overall Financial Resource Strain (CARDIA)    Difficulty of Paying Living Expenses: Not hard at all  Food Insecurity: No Food Insecurity (03/14/2024)   Received from [facility]   Hunger Vital Sign    Within the past 12 months, you worried that your food would run out before you got the money to buy more.: Never true    Within the past 12 months, the food you bought just didn't last and you didn't have money to get more.: Never true  Transportation Needs: No Transportation Needs (03/14/2024)   Received from [facility] - Transportation    In the past 12 months, has lack of transportation kept you from medical appointments or from getting medications?: No    Lack of Transportation (Non-Medical): No  Physical Activity: Not on file  Stress: Not on file  Social Connections: Not on file  Intimate Partner Violence: Not on file    Review of Systems: See HPI, otherwise negative ROS  Physical Exam: BP (!) 143/82   Temp (!) 97.3 F (36.3 C) (Temporal)   Resp (!) 74   Ht 5' 5.98 (1.676 m)   Wt 179 lb 3.2 oz (81.3 kg)   SpO2 99%   BMI 28.94 kg/m  General:   Alert,  pleasant and cooperative in NAD Head:  Normocephalic and atraumatic. Neck:  Supple; no masses or thyromegaly. Lungs:  Clear throughout to auscultation.    Heart:  Regular rate and rhythm. Abdomen:  Soft, nontender and nondistended. Normal bowel sounds, without guarding, and without rebound.   Neurologic:  Alert and  oriented x4;  grossly normal neurologically.  Impression/Plan: Angela Delacruz is here for an endoscopy and colonoscopy to be performed for elevated fecalcalprotectin levels,  pancolitis on CT scan, h/o gerd with erosive esophagitis  Risks, benefits, limitations, and alternatives regarding  endoscopy and colonoscopy have been reviewed with the patient.  Questions have been answered.  All parties agreeable.   Angela Brooklyn, MD  03/21/2024, 10:40 AM

## 2024-03-22 ENCOUNTER — Encounter: Payer: Self-pay | Admitting: Gastroenterology

## 2024-03-23 ENCOUNTER — Ambulatory Visit: Payer: Self-pay | Admitting: Gastroenterology

## 2024-03-23 LAB — SURGICAL PATHOLOGY

## 2024-03-23 NOTE — Progress Notes (Signed)
 Pathology results from colonoscopy came back normal.  There is no evidence of inflammation Could empirically treat with 2 weeks course of rifaximin 550 mg 3 times daily  RV

## 2024-04-01 ENCOUNTER — Encounter
